# Patient Record
Sex: Female | Born: 1960 | Race: White | Hispanic: No | Marital: Married | Smoking: Current every day smoker
Health system: Southern US, Community
[De-identification: ages and names within clinical notes are randomized; demographics above are authoritative.]

## PROBLEM LIST (undated history)

## (undated) DIAGNOSIS — K219 Gastro-esophageal reflux disease without esophagitis: Secondary | ICD-10-CM

## (undated) DIAGNOSIS — F329 Major depressive disorder, single episode, unspecified: Secondary | ICD-10-CM

## (undated) DIAGNOSIS — I10 Essential (primary) hypertension: Secondary | ICD-10-CM

## (undated) DIAGNOSIS — F32A Depression, unspecified: Secondary | ICD-10-CM

## (undated) DIAGNOSIS — F988 Other specified behavioral and emotional disorders with onset usually occurring in childhood and adolescence: Secondary | ICD-10-CM

## (undated) DIAGNOSIS — F419 Anxiety disorder, unspecified: Secondary | ICD-10-CM

## (undated) HISTORY — PX: FINGER SURGERY: SHX640

## (undated) HISTORY — DX: Depression, unspecified: F32.A

## (undated) HISTORY — DX: Other specified behavioral and emotional disorders with onset usually occurring in childhood and adolescence: F98.8

## (undated) HISTORY — DX: Major depressive disorder, single episode, unspecified: F32.9

## (undated) HISTORY — DX: Anxiety disorder, unspecified: F41.9

---

## 1998-11-13 ENCOUNTER — Inpatient Hospital Stay (HOSPITAL_COMMUNITY): Admission: EM | Admit: 1998-11-13 | Discharge: 1998-11-17 | Payer: Self-pay | Admitting: *Deleted

## 1998-11-13 ENCOUNTER — Emergency Department (HOSPITAL_COMMUNITY): Admission: EM | Admit: 1998-11-13 | Discharge: 1998-11-13 | Payer: Self-pay | Admitting: Emergency Medicine

## 1998-11-18 ENCOUNTER — Encounter (HOSPITAL_COMMUNITY): Admission: RE | Admit: 1998-11-18 | Discharge: 1999-02-16 | Payer: Self-pay

## 2001-06-19 ENCOUNTER — Encounter: Admission: RE | Admit: 2001-06-19 | Discharge: 2001-06-19 | Payer: Self-pay | Admitting: Family Medicine

## 2001-06-19 ENCOUNTER — Encounter: Payer: Self-pay | Admitting: Family Medicine

## 2003-07-14 ENCOUNTER — Emergency Department (HOSPITAL_COMMUNITY): Admission: EM | Admit: 2003-07-14 | Discharge: 2003-07-14 | Payer: Self-pay | Admitting: Emergency Medicine

## 2005-04-27 ENCOUNTER — Encounter: Admission: RE | Admit: 2005-04-27 | Discharge: 2005-04-27 | Payer: Self-pay | Admitting: Family Medicine

## 2005-04-28 ENCOUNTER — Inpatient Hospital Stay (HOSPITAL_COMMUNITY): Admission: AD | Admit: 2005-04-28 | Discharge: 2005-04-28 | Payer: Self-pay | Admitting: Obstetrics and Gynecology

## 2005-05-01 ENCOUNTER — Inpatient Hospital Stay (HOSPITAL_COMMUNITY): Admission: AD | Admit: 2005-05-01 | Discharge: 2005-05-01 | Payer: Self-pay | Admitting: Obstetrics and Gynecology

## 2005-05-04 ENCOUNTER — Inpatient Hospital Stay (HOSPITAL_COMMUNITY): Admission: AD | Admit: 2005-05-04 | Discharge: 2005-05-04 | Payer: Self-pay | Admitting: Obstetrics & Gynecology

## 2005-11-13 ENCOUNTER — Encounter: Admission: RE | Admit: 2005-11-13 | Discharge: 2005-11-13 | Payer: Self-pay | Admitting: Family Medicine

## 2008-08-24 LAB — CONVERTED CEMR LAB: Pap Smear: NORMAL

## 2008-11-11 ENCOUNTER — Ambulatory Visit: Payer: Self-pay | Admitting: Internal Medicine

## 2008-11-11 DIAGNOSIS — Z9189 Other specified personal risk factors, not elsewhere classified: Secondary | ICD-10-CM | POA: Insufficient documentation

## 2008-11-11 DIAGNOSIS — F329 Major depressive disorder, single episode, unspecified: Secondary | ICD-10-CM

## 2008-11-11 DIAGNOSIS — F988 Other specified behavioral and emotional disorders with onset usually occurring in childhood and adolescence: Secondary | ICD-10-CM | POA: Insufficient documentation

## 2008-11-11 DIAGNOSIS — F341 Dysthymic disorder: Secondary | ICD-10-CM | POA: Insufficient documentation

## 2008-11-11 DIAGNOSIS — K148 Other diseases of tongue: Secondary | ICD-10-CM | POA: Insufficient documentation

## 2008-11-11 HISTORY — DX: Dysthymic disorder: F34.1

## 2009-01-12 ENCOUNTER — Ambulatory Visit: Payer: Self-pay | Admitting: Internal Medicine

## 2009-01-12 DIAGNOSIS — L708 Other acne: Secondary | ICD-10-CM

## 2009-03-23 ENCOUNTER — Ambulatory Visit: Payer: Self-pay | Admitting: Family Medicine

## 2009-03-23 DIAGNOSIS — R1031 Right lower quadrant pain: Secondary | ICD-10-CM | POA: Insufficient documentation

## 2009-03-23 LAB — CONVERTED CEMR LAB
ALT: 18 units/L (ref 0–35)
AST: 17 units/L (ref 0–37)
Albumin: 4.3 g/dL (ref 3.5–5.2)
Calcium: 9.1 mg/dL (ref 8.4–10.5)
Chloride: 104 meq/L (ref 96–112)
Lymphocytes Relative: 34 % (ref 12–46)
Lymphs Abs: 1.9 10*3/uL (ref 0.7–4.0)
Neutrophils Relative %: 56 % (ref 43–77)
Platelets: 240 10*3/uL (ref 150–400)
Potassium: 4.1 meq/L (ref 3.5–5.3)
Sodium: 139 meq/L (ref 135–145)
WBC: 5.7 10*3/uL (ref 4.0–10.5)

## 2010-05-19 ENCOUNTER — Emergency Department (HOSPITAL_COMMUNITY): Admission: EM | Admit: 2010-05-19 | Discharge: 2010-05-19 | Payer: Self-pay | Admitting: Emergency Medicine

## 2010-05-24 ENCOUNTER — Ambulatory Visit: Payer: Self-pay | Admitting: Internal Medicine

## 2010-05-24 DIAGNOSIS — R0789 Other chest pain: Secondary | ICD-10-CM

## 2010-07-04 ENCOUNTER — Telehealth (INDEPENDENT_AMBULATORY_CARE_PROVIDER_SITE_OTHER): Payer: Self-pay | Admitting: *Deleted

## 2010-09-25 ENCOUNTER — Encounter: Payer: Self-pay | Admitting: Family Medicine

## 2010-10-04 NOTE — Progress Notes (Signed)
  Phone Note Other Incoming   Request: Send information Summary of Call: Request for records received from Dr. Doreatha Martin. Request forwarded to [facility].

## 2010-10-04 NOTE — Assessment & Plan Note (Signed)
Summary: FOLLOW UP FROM [facility]--Rm 2   Vital Signs:  Patient profile:   50 year old female Menstrual status:  regular Height:      68.75 inches Weight:      133.75 pounds BMI:     19.97 O2 Sat:      100 % on Room air Temp:     98.5 degrees F oral Pulse rate:   108 / minute Pulse rhythm:   regular Resp:     16 per minute BP sitting:   144 / 90  (right arm) Cuff size:   regular  Vitals Entered By: Mervin Kung CMA Duncan Dull) (May 24, 2010 1:42 PM)  O2 Flow:  Room air CC: Rm 2  Follow up from ER visit last Thursday. Is Patient Diabetic? No Comments Pt states she can't take Flexeril with the Pristiq and she would like to find something that can be taken with Pristiq.  Methylphenidate 10mg  increased to 1 three times a day. Methylphenidate 5mg  decreased to 1 once daily. Xanax increased to 1 once daily.  Nicki Guadalajara Fergerson CMA Duncan Dull)  May 24, 2010 1:49 PM    Primary Care Provider:  D. Thomos Lemons DO  CC:  Rm 2  Follow up from ER visit last Thursday.Marland Kitchen  History of Present Illness: 50 y/o female for ER f/u pt evaluated for chest pain on and off chest pain,  non exertional for 3-4 weeks typically working at the computer lasts for 3-4 hrs feels like pressure,  feels like bruise after mild assoc shortness nap seemed help xanax also seems to help diastolic bp elevated increase in belching severity 2 out of 10   a lot of stress - new job,  going to school for accounting  3 cups of coffee  CXR - - NAD D Dimer normal EKG - sinus tach at 121 bpm,  ST wave abnormality, consider inferior infarct CBC, TFTs normal    Preventive Screening-Counseling & Management  Alcohol-Tobacco     Alcohol drinks/day: 0     Smoking Status: quit < 6 months     Packs/Day: 4 cigarettes daily     Year Started: 1980  Allergies (verified): No Known Drug Allergies  Past History:  Past Medical History: Depression -  previously on Effexor Previous hx of drug and alcohol  use  (crack cocaine) - drug free 9 yrs  ADD - Dx by Dr. Rise Mu  (Dr Jennelle Human) Hx of frequent headaches  GERD Adult Acne  Hx of ectopic pregnancy Depression  Past Surgical History: Tonsillectomy     Family History: Family hx of depression - father was alcoholic,  mother from dysfunctional family Sister has OCD, depression, high cholesterol, htn Niece - ? 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  Review of Systems  The patient denies chest pain, dyspnea on exertion, and prolonged cough.    Physical Exam  General:  alert and thin Head:  normocephalic and atraumatic.   Chest Wall:  no chest wall tenderness Lungs:  normal respiratory effort, normal breath sounds, no crackles, and no wheezes.   Heart:  regular rhythm, no murmur, no gallop, and tachycardia.   Abdomen:  soft, non-tender, and normal bowel sounds.   Extremities:  No lower extremity  edema  Neurologic:  cranial nerves II-XII intact and gait normal.   Psych:  normally interactive, good eye contact, not anxious appearing, and not depressed appearing.     Impression & Recommendations:  Problem # 1:  CHEST PAIN, ATYPICAL (ICD-786.59) atypical chest pain. probable GERD use PPI,  avoid caffeine Patient advised to call office if symptoms persist or worsen.  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 One by mouth once a day 2)  Omeprazole 20 Mg Tbec (Omeprazole) .... Take 1 tablet by mouth once a day 3)  Flexeril 5 Mg Tabs (Cyclobenzaprine hcl) .... Take 1 tablet by mouth once a day as needed back pain 4)   Retin-a 0.025 % Crea (Tretinoin) .... Apply to affected area at bedtime 5)  Xanax 0.5 Mg Tabs (Alprazolam) .... Take 1  tablet by mouth once a day as needed 6)  Acetaminophen Extra Strength 500 Mg Tabs (Acetaminophen) .... Take 1 tablet by mouth two times a day as needed back pain 7)  Pristiq 50 Mg Xr24h-tab (Desvenlafaxine succinate) .... Take 1 tablet by mouth every morning 8)  Minocycline Hcl 50 Mg Caps (Minocycline hcl) .... One by mouth qd  Patient Instructions: 1)  Please schedule a follow-up appointment in 1 month. 2)  Call our office if your symptoms do not  improve or gets worse. 3)  Decrease your caffeine use to one cup per day Prescriptions: OMEPRAZOLE 20 MG TBEC (OMEPRAZOLE) Take 1 tablet by mouth once a day  #90 x 1   Entered and Authorized by:   D. Thomos Lemons DO   Signed by:   D. Thomos Lemons DO on 05/24/2010   Method used:   Electronically to        [facility]  [address] [phone]* (retail)       [address]       [place], [place]  [address]       Ph: [phone] or [phone]       Fax: [phone]   RxID:   [id number]   Current Allergies (reviewed today): No known allergies

## 2010-11-17 LAB — COMPREHENSIVE METABOLIC PANEL
Alkaline Phosphatase: 75 U/L (ref 39–117)
BUN: 7 mg/dL (ref 6–23)
CO2: 30 mEq/L (ref 19–32)
Chloride: 104 mEq/L (ref 96–112)
GFR calc non Af Amer: >60 mL/min (ref >60)
Glucose, Bld: 91 mg/dL (ref 70–99)
Potassium: 3.6 mEq/L (ref 3.5–5.1)
Total Bilirubin: 1 mg/dL (ref 0.3–1.2)

## 2010-11-17 LAB — POCT I-STAT, CHEM 8
Calcium, Ion: 1.16 mmol/L (ref 1.12–1.32)
Glucose, Bld: 101 mg/dL — ABNORMAL HIGH (ref 70–99)
HCT: 42 % (ref 36.0–46.0)
Hemoglobin: 14.3 g/dL (ref 12.0–15.0)
Potassium: 3.6 mEq/L (ref 3.5–5.1)
TCO2: 27 mmol/L (ref 0–100)

## 2010-11-17 LAB — CBC
HCT: 39.1 % (ref 36.0–46.0)
MCHC: 33.9 g/dL (ref 30.0–36.0)
RDW: 12.9 % (ref 11.5–15.5)

## 2010-11-17 LAB — T4, FREE: Free T4: 0.85 ng/dL (ref 0.80–1.80)

## 2010-11-17 LAB — DIFFERENTIAL
Basophils Absolute: 0 10*3/uL (ref 0.0–0.1)
Basophils Relative: 1 % (ref 0–1)
Monocytes Absolute: 0.3 10*3/uL (ref 0.1–1.0)
Neutro Abs: 5.4 10*3/uL (ref 1.7–7.7)
Neutrophils Relative %: 74 % (ref 43–77)

## 2010-11-17 LAB — POCT CARDIAC MARKERS: CKMB, poc: <1 ng/mL — ABNORMAL LOW (ref 1.0–8.0)

## 2010-11-17 LAB — MAGNESIUM: Magnesium: 2.1 mg/dL (ref 1.5–2.5)

## 2012-02-08 ENCOUNTER — Emergency Department (HOSPITAL_COMMUNITY)
Admission: EM | Admit: 2012-02-08 | Discharge: 2012-02-08 | Disposition: A | Discharge status: Home / Self Care | Payer: BC Managed Care – PPO | Attending: Emergency Medicine | Admitting: Emergency Medicine

## 2012-02-08 ENCOUNTER — Encounter (HOSPITAL_COMMUNITY): Payer: Self-pay | Admitting: Emergency Medicine

## 2012-02-08 ENCOUNTER — Emergency Department (HOSPITAL_COMMUNITY): Payer: BC Managed Care – PPO

## 2012-02-08 DIAGNOSIS — F17200 Nicotine dependence, unspecified, uncomplicated: Secondary | ICD-10-CM | POA: Insufficient documentation

## 2012-02-08 DIAGNOSIS — F341 Dysthymic disorder: Secondary | ICD-10-CM | POA: Insufficient documentation

## 2012-02-08 DIAGNOSIS — E876 Hypokalemia: Secondary | ICD-10-CM | POA: Insufficient documentation

## 2012-02-08 DIAGNOSIS — K219 Gastro-esophageal reflux disease without esophagitis: Secondary | ICD-10-CM | POA: Insufficient documentation

## 2012-02-08 DIAGNOSIS — I10 Essential (primary) hypertension: Secondary | ICD-10-CM | POA: Insufficient documentation

## 2012-02-08 DIAGNOSIS — R079 Chest pain, unspecified: Secondary | ICD-10-CM | POA: Insufficient documentation

## 2012-02-08 HISTORY — DX: Gastro-esophageal reflux disease without esophagitis: K21.9

## 2012-02-08 HISTORY — DX: Major depressive disorder, single episode, unspecified: F32.9

## 2012-02-08 HISTORY — DX: Essential (primary) hypertension: I10

## 2012-02-08 HISTORY — DX: Depression, unspecified: F32.A

## 2012-02-08 HISTORY — DX: Anxiety disorder, unspecified: F41.9

## 2012-02-08 LAB — DIFFERENTIAL
Basophils Absolute: 0 10*3/uL (ref 0.0–0.1)
Eosinophils Absolute: 0.1 10*3/uL (ref 0.0–0.7)
Eosinophils Relative: 1 % (ref 0–5)
Monocytes Absolute: 0.3 10*3/uL (ref 0.1–1.0)

## 2012-02-08 LAB — RAPID URINE DRUG SCREEN, HOSP PERFORMED
Barbiturates: NOT DETECTED
Benzodiazepines: NOT DETECTED
Cocaine: NOT DETECTED
Tetrahydrocannabinol: NOT DETECTED

## 2012-02-08 LAB — CBC
HCT: 37.1 % (ref 36.0–46.0)
MCH: 27.6 pg (ref 26.0–34.0)
MCV: 86.1 fL (ref 78.0–100.0)
Platelets: 208 10*3/uL (ref 150–400)
RDW: 14 % (ref 11.5–15.5)

## 2012-02-08 LAB — BASIC METABOLIC PANEL
Calcium: 9.2 mg/dL (ref 8.4–10.5)
Creatinine, Ser: 0.68 mg/dL (ref 0.50–1.10)
GFR calc non Af Amer: >90 mL/min (ref >90)
Glucose, Bld: 95 mg/dL (ref 70–99)
Sodium: 135 mEq/L (ref 135–145)

## 2012-02-08 LAB — TROPONIN I: Troponin I: <0.3 ng/mL (ref <0.30)

## 2012-02-08 MED ORDER — PANTOPRAZOLE SODIUM 40 MG IV SOLR
40.0000 mg | Freq: Once | INTRAVENOUS | Status: AC
  Administered 2012-02-08: 40 mg via INTRAVENOUS
  Filled 2012-02-08: qty 40

## 2012-02-08 MED ORDER — POTASSIUM CHLORIDE CRYS ER 20 MEQ PO TBCR
40.0000 meq | EXTENDED_RELEASE_TABLET | Freq: Once | ORAL | Status: AC
  Administered 2012-02-08: 40 meq via ORAL
  Filled 2012-02-08: qty 1

## 2012-02-08 NOTE — ED Notes (Signed)
Pt aware that we need to collect urine specimen. 

## 2012-02-08 NOTE — ED Notes (Signed)
Pt received from PDA. Pt placed on monitor.

## 2012-02-08 NOTE — Discharge Instructions (Signed)
Read the information below.  Please call your primary care provider tomorrow morning to schedule a close follow up appointment for further evaluation of your chest pain.  You may return to the ER at any time for worsening condition or any new symptoms that concern you.

## 2012-02-08 NOTE — ED Notes (Signed)
Pt with chest pressure constant for the last week. Went to PMD today for eval. 12lead ekg in office. 324mg  ASA given at pmd office. 1 nitro SL for EMS in route no change in pain. Feeling tired, weak. 20g l hand. Restarted bp 2 days ago.

## 2012-02-08 NOTE — ED Notes (Signed)
Report given to [facility] in CDU;  Ambulatory to CDU at this time

## 2012-02-08 NOTE — ED Provider Notes (Addendum)
History     CSN: [id number]  Arrival date & time 02/08/12  1500   First MD Initiated Contact with Patient 02/08/12 1501      Chief Complaint  Patient presents with  . Chest Pain    (Consider location/radiation/quality/duration/timing/severity/associated sxs/prior treatment) HPI Comments: Patient presents for chest pressure that ongoing over the last week.  Patient has no specific shortness of breath.  She states she's had intermittent nausea but no vomiting.  No cough or fevers.  Patient notes that she feels slightly more tired this last week.  She's not been taking her blood pressure medication or her acid reflux medication over the last week.  She did start taking them again 2 days ago.  She went to her primary care physician's office today and they sent her here for further evaluation.  Patient notes her pain is a 2/10 which is where it has remained throughout today.  The nitroglycerin she received prior to arrival did not change her pain.  She has already received 324 mg of aspirin as well.    Patient is a 51 y.o. female presenting with chest pain. The history is provided by the patient and the EMS personnel. No language interpreter was used.  Chest Pain Primary symptoms include nausea. Pertinent negatives for primary symptoms include no fever, no shortness of breath, no cough, no abdominal pain and no vomiting.     Past Medical History  Diagnosis Date  . Hypertension   . GERD (gastroesophageal reflux disease)   . Anxiety and depression     No past surgical history on file.  No family history on file.  History  Substance Use Topics  . Smoking status: Current Everyday Smoker -- 0.5 packs/day  . Smokeless tobacco: Not on file  . Alcohol Use: No     Alcohol abuse in remission    OB History    Grav Para Term Preterm Abortions TAB SAB Ect Mult Living                  Review of Systems  Constitutional: Negative.  Negative for fever and chills.  HENT: Negative.     Eyes: Negative.  Negative for discharge and redness.  Respiratory: Negative.  Negative for cough and shortness of breath.   Cardiovascular: Positive for chest pain.  Gastrointestinal: Positive for nausea. Negative for vomiting, abdominal pain and diarrhea.  Genitourinary: Negative.  Negative for dysuria.  Musculoskeletal: Negative.  Negative for back pain.  Skin: Negative.  Negative for color change and rash.  Neurological: Negative.  Negative for syncope and headaches.  Hematological: Negative.  Negative for adenopathy.  Psychiatric/Behavioral: Negative.  Negative for confusion.  All other systems reviewed and are negative.    Allergies  Review of patient's allergies indicates no known allergies.  Home Medications  No current outpatient prescriptions on file.  BP 144/90  Pulse 90  Temp 98.1 F (36.7 C)  Resp 11  SpO2 100%  Physical Exam  Nursing note and vitals reviewed. Constitutional: She is oriented to person, place, and time. She appears well-developed and well-nourished.  Non-toxic appearance. She does not have a sickly appearance.  HENT:  Head: Normocephalic and atraumatic.  Eyes: Conjunctivae, EOM and lids are normal. Pupils are equal, round, and reactive to light. No scleral icterus.  Neck: Trachea normal and normal range of motion. Neck supple.  Cardiovascular: Normal rate, regular rhythm and normal heart sounds.   Pulmonary/Chest: Effort normal and breath sounds normal. No respiratory distress. She has no wheezes.  She has no rales.  Abdominal: Soft. Normal appearance. There is no tenderness. There is no rebound, no guarding and no CVA tenderness.  Musculoskeletal: Normal range of motion. She exhibits no edema.  Neurological: She is alert and oriented to person, place, and time. She has normal strength.  Skin: Skin is warm, dry and intact. No rash noted.  Psychiatric: She has a normal mood and affect. Her behavior is normal. Judgment and thought content normal.     ED Course  Procedures (including critical care time)   Results for orders placed during the hospital encounter of 02/08/12  CBC      Component Value Range   WBC 5.8  4.0 - 10.5 (K/uL)   RBC 4.31  3.87 - 5.11 (MIL/uL)   Hemoglobin 11.9 (*) 12.0 - 15.0 (g/dL)   HCT 16.1  09.6 - 04.5 (%)   MCV 86.1  78.0 - 100.0 (fL)   MCH 27.6  26.0 - 34.0 (pg)   MCHC 32.1  30.0 - 36.0 (g/dL)   RDW 40.9  81.1 - 91.4 (%)   Platelets 208  150 - 400 (K/uL)  DIFFERENTIAL      Component Value Range   Neutrophils Relative 71  43 - 77 (%)   Neutro Abs 4.2  1.7 - 7.7 (K/uL)   Lymphocytes Relative 22  12 - 46 (%)   Lymphs Abs 1.3  0.7 - 4.0 (K/uL)   Monocytes Relative 5  3 - 12 (%)   Monocytes Absolute 0.3  0.1 - 1.0 (K/uL)   Eosinophils Relative 1  0 - 5 (%)   Eosinophils Absolute 0.1  0.0 - 0.7 (K/uL)   Basophils Relative 1  0 - 1 (%)   Basophils Absolute 0.0  0.0 - 0.1 (K/uL)  BASIC METABOLIC PANEL      Component Value Range   Sodium 135  135 - 145 (mEq/L)   Potassium 3.0 (*) 3.5 - 5.1 (mEq/L)   Chloride 96  96 - 112 (mEq/L)   CO2 27  19 - 32 (mEq/L)   Glucose, Bld 95  70 - 99 (mg/dL)   BUN 8  6 - 23 (mg/dL)   Creatinine, Ser 7.82  0.50 - 1.10 (mg/dL)   Calcium 9.2  8.4 - 95.6 (mg/dL)   GFR calc non Af Amer >90  >90 (mL/min)   GFR calc Af Amer >90  >90 (mL/min)  TROPONIN I      Component Value Range   Troponin I <0.30  <0.30 (ng/mL)   Dg Chest 2 View  02/08/2012  *RADIOLOGY REPORT*  Clinical Data: Chest pain.  Smoker.  History of hypertension.  CHEST - 2 VIEW  Comparison: 05/19/2010.  Findings: Normal sized heart.  Clear lungs.  The lungs are mildly hyperexpanded with mild diffuse peribronchial thickening.  Minimal scoliosis.  IMPRESSION: Mild changes of COPD and chronic bronchitis.  No acute abnormality.  Original Report Authenticated By: Darrol Angel, M.D.      Date: 02/08/2012  Rate: 90  Rhythm: normal sinus rhythm  QRS Axis: normal  Intervals: normal  ST/T Wave abnormalities:  nonspecific ST/T changes  Conduction Disutrbances:none  Narrative Interpretation:   Old EKG Reviewed: unchanged from 05/19/2010     MDM  Patient with symptoms of chest pressure over the last week seems to worsen with exertion and improved with rest.  She does have some risk factors given a smoking history, hypertension and remote use of cocaine.  Patient's EKG is unchanged from her prior EKG in 2011.  Patient may be a good candidate to receive multiple substance of cardiac markers and if they are normal be arranged for an outpatient stress test.  Nat Christen, MD 02/08/12 1517  Patient has an initial troponin that is negative.  Her potassium is slightly low and should be given potassium here for supplementation.  Patient is relatively low risk and I did offer her the lowest chest pain protocol to stay overnight for a stress test in the morning.  Patient prefers to be discharged after her second cardiac marker with followup with her primary care physician for an outpatient stress test.  Given that the patient is relatively low risk I feel that this is also an appropriate pathway for this patient.  TIMI score 0.    Patient to be moved to CDU while awaiting her repeat troponin.  Nat Christen, MD 02/08/12 [phone]

## 2012-02-08 NOTE — ED Provider Notes (Signed)
5:52 PM Patient is in CDU holding for repeat troponin at 6:15pm.  This is a shared visit with Dr Golda Acre.  Pt has no needs at this time. Pt continues to decline chest pain protocol. On exam, pt is A&Ox4, NAD, RRR, no m/r/g, CTAB, abd soft, NT, extremities without edema, distal pulses intact and equal bilaterally.     7:09 PM Repeat troponin is negative.  Pt to be d/c home with PCP follow up.  Patient verbalizes understanding and agrees with plan.    Rise Patience, [place] 02/08/12 1910

## 2012-02-08 NOTE — ED Notes (Signed)
Pt denies any chest pain or discomfort at this time.  Pt eating sandwich from [employer], husband at bedside.

## 2012-02-10 NOTE — ED Provider Notes (Signed)
Medical screening examination/treatment/procedure(s) were conducted as a shared visit with non-physician practitioner(s) and myself.  I personally evaluated the patient during the encounter   Nat Christen, MD 02/10/12 [phone]

## 2013-12-27 ENCOUNTER — Ambulatory Visit (INDEPENDENT_AMBULATORY_CARE_PROVIDER_SITE_OTHER): Payer: BC Managed Care – PPO | Admitting: Family Medicine

## 2013-12-27 VITALS — BP 122/74 | HR 73 | Temp 99.0°F | Resp 18 | Ht 69.0 in | Wt 139.0 lb

## 2013-12-27 DIAGNOSIS — S61419A Laceration without foreign body of unspecified hand, initial encounter: Secondary | ICD-10-CM

## 2013-12-27 DIAGNOSIS — Z23 Encounter for immunization: Secondary | ICD-10-CM

## 2013-12-27 DIAGNOSIS — S66829A Laceration of other specified muscles, fascia and tendons at wrist and hand level, unspecified hand, initial encounter: Secondary | ICD-10-CM

## 2013-12-27 DIAGNOSIS — S61012A Laceration without foreign body of left thumb without damage to nail, initial encounter: Secondary | ICD-10-CM

## 2013-12-27 DIAGNOSIS — S61209A Unspecified open wound of unspecified finger without damage to nail, initial encounter: Secondary | ICD-10-CM

## 2013-12-27 DIAGNOSIS — M79609 Pain in unspecified limb: Secondary | ICD-10-CM

## 2013-12-27 DIAGNOSIS — M79645 Pain in left finger(s): Secondary | ICD-10-CM

## 2013-12-27 MED ORDER — CEPHALEXIN 500 MG PO CAPS: 500.0000 mg | ORAL_CAPSULE | Freq: Three times a day (TID) | ORAL | Status: DC

## 2013-12-27 MED ORDER — FLUCONAZOLE 150 MG PO TABS: 150.0000 mg | ORAL_TABLET | Freq: Once | ORAL | Status: DC

## 2013-12-27 NOTE — Progress Notes (Signed)
I directly supervised and participated in the procedure and agree with the student's documentation. Due to joint capsule and extensor tendon damage, the patient is started on antibiotics and referred to Hand Surgery.

## 2013-12-27 NOTE — Patient Instructions (Signed)
WOUND CARE . Keep area clean and dry for 24 hours. Do not remove bandage, if applied. . After 24 hours, remove bandage and wash wound gently with mild soap and warm water. Reapply a new bandage after cleaning wound, if directed. . Continue daily cleansing with soap and water until stitches/staples are removed. . Do not apply any ointments or creams to the wound while stitches/staples are in place, as this may cause delayed healing. . Notify the office if you experience any of the following signs of infection: Swelling, redness, pus drainage, streaking, fever >101.0 F . Notify the office if you experience excessive bleeding that does not stop after 15-20 minutes of constant, firm pressure.   WE WILL CONTACT YOU ON Monday WITH THE APPOINTMENT WITH THE HAND SURGEON.  IF YOU HAVE NOT HEARD FROM US BY Monday AT 4:00PM, PLEASE CALL OUR REFERRAL COORDINATOR FOR AN UPDATE AT [phone].

## 2013-12-27 NOTE — Progress Notes (Signed)
VCO.  Wound of L thumb washed with soap and water.  Sterile drape applied.  Digital block with 3cc of 2% lidocaine, additional local with 0.5cc.  Joint capsule is damaged.  Also extensor tendon appears partially lacerated. Closed skin with #1 5-0 prolene SI suture and #1 HM suture.  Wound cleaned.  Non-adhesive bandage applied.

## 2013-12-27 NOTE — Progress Notes (Addendum)
This chart was scribed for Ethelda ChickKristi M Oliva Montecalvo, MD by Luisa DagoPriscilla Tutu, ED Scribe. This patient was seen in room 6 and the patient's care was started at 1:23 PM. Subjective:    Patient ID: Kristy Rivas, female    DOB: 12/12/1960, [age] y.o.   MRN: [id number]  12/27/2013  Laceration  HPI HPI Comments: Kristy Rivas is a 53 y.o. female who presents to the [facility] complaining of a laceration to left thumb that occurred about 1 hour ago. Pt states that a picture frame fell on her head and then cut her L  hand. She denies any head trauma or LOC. Her last tetanus vaccine was in 2009. Pt denies any numbness or tingling to her left thumb. Pt is Right hand dominant. She states that she experiences associated pain when she tries to move her thumb. Denies any paraesthesia, fever, or chills.   Pt is an Airline pilotaccountant by trade.  Review of Systems  Constitutional: Negative for fever and chills.  HENT: Negative for congestion.   Respiratory: Negative for shortness of breath.   Gastrointestinal: Negative for nausea, vomiting, abdominal pain and diarrhea.  Skin: Positive for wound (laceration to left thumb). Negative for color change and pallor.  Neurological: Negative for weakness and numbness.    Past Medical History  Diagnosis Date  . Hypertension   . GERD (gastroesophageal reflux disease)   . Anxiety and depression   . Anxiety   . Depression    No Known Allergies Current Outpatient Prescriptions  Medication Sig Dispense Refill  . ALPRAZolam (XANAX) 0.5 MG tablet Take 0.5 mg by mouth at bedtime as needed. For sleep      . desvenlafaxine (PRISTIQ) 100 MG 24 hr tablet Take 100 mg by mouth daily.      . hydrOXYzine (ATARAX/VISTARIL) 50 MG tablet Take 50 mg by mouth 3 times/day as needed-between meals & bedtime.      Marland Kitchen. losartan (COZAAR) 25 MG tablet Take 25 mg by mouth daily.      . methylphenidate (RITALIN) 10 MG tablet Take 10 mg by mouth 3 (three) times daily.      Marland Kitchen.  omeprazole (PRILOSEC) 20 MG capsule Take 40 mg by mouth daily.      Marland Kitchen. tretinoin (RETIN-A) 0.05 % cream Apply 1 application topically at bedtime.      Marland Kitchen. acetaminophen (TYLENOL) 500 MG tablet Take 500 mg by mouth every 6 (six) hours as needed. For pain      . cephALEXin (KEFLEX) 500 MG capsule Take 1 capsule (500 mg total) by mouth 3 (three) times daily.  30 capsule  0  . fluconazole (DIFLUCAN) 150 MG tablet Take 1 tablet (150 mg total) by mouth once. Repeat if needed  2 tablet  0   No current facility-administered medications for this visit.   History   Social History  . Marital Status: Married    Spouse Name: N/A    Number of Children: N/A  . Years of Education: N/A   Occupational History  . Not on file.   Social History Main Topics  . Smoking status: Current Every Day Smoker -- 0.50 packs/day  . Smokeless tobacco: Not on file  . Alcohol Use: No     Comment: Alcohol abuse in remission  . 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 Sexual Activity:    Other Topics Concern  . Not on file   Social History Narrative  .  No narrative on file       Objective:    BP 122/74  Pulse 73  Temp(Src) 99 F (37.2 C) (Oral)  Resp 18  Ht 5\' 9"  (1.753 m)  Wt 139 lb (63.05 kg)  BMI 20.52 kg/m2  SpO2 100%  LMP 12/25/2013  Physical Exam  Nursing note and vitals reviewed. Constitutional: She is oriented to person, place, and time. She appears well-developed and well-nourished. No distress.  HENT:  Head: Normocephalic and atraumatic.  Eyes: Conjunctivae and EOM are normal. Pupils are equal, round, and reactive to light. Right eye exhibits no discharge. Left eye exhibits no discharge.  Neck: Neck supple.  Cardiovascular: Normal rate, regular rhythm and normal heart sounds.  Exam reveals no gallop and no friction rub.   No murmur heard. Pulmonary/Chest: Effort normal and breath sounds normal. No respiratory distress.  Abdominal: Soft. She exhibits no  distension. There is no tenderness.  Musculoskeletal: She exhibits no edema and no tenderness.  FROM of left thumb. Motor 5/5 L thumb.    Neurological: She is alert and oriented to person, place, and time. No cranial nerve deficit. She exhibits normal muscle tone. Coordination normal.   No sensory or motor deficits appreciated.   Skin: Skin is warm and dry.  Horizonatal laceration to her left proximal thumb. No active bleeding.  Psychiatric: She has a normal mood and affect. Her behavior is normal. Thought content normal.       Assessment & Plan:   1:35 PM- Will order a laceration repair procedure. Pt advised of plan for treatment and pt agrees.  1. Pain of left thumb   2. Laceration of left thumb   3. Need for prophylactic vaccination with combined diphtheria-tetanus-pertussis (DTP) vaccine   4. Laceration of extensor tendon of hand    1. Pain L thumb:  New. Secondary to laceration; recommend Tylenol or Motrin. 2.  L thumb laceration:  New. With capsular involvement of IP joint and extensor tendon partial laceration; refer to hand surgery within upcoming 2-3 days.  Rx for Keflex provided.   S/p suture repair in office; s/p TDAP.  Local wound care reviewed. 3.  S/p TDAP.   Meds ordered this encounter  Medications  . hydrOXYzine (ATARAX/VISTARIL) 50 MG tablet    Sig: Take 50 mg by mouth 3 times/day as needed-between meals & bedtime.  . cephALEXin (KEFLEX) 500 MG capsule    Sig: Take 1 capsule (500 mg total) by mouth 3 (three) times daily.    Dispense:  30 capsule    Refill:  0  . fluconazole (DIFLUCAN) 150 MG tablet    Sig: Take 1 tablet (150 mg total) by mouth once. Repeat if needed    Dispense:  2 tablet    Refill:  0    No Follow-up on file.   I personally performed the services described in this documentation, which was scribed in my presence. The recorded information has been reviewed and is accurate.  Nilda SimmerKristi Shifra Swartzentruber, M.D.  [facility]  [facility] [address] [place], [place]  [id number] [phone] phone [phone] fax

## 2014-02-01 ENCOUNTER — Other Ambulatory Visit: Payer: Self-pay | Admitting: Family Medicine

## 2014-02-02 NOTE — Telephone Encounter (Signed)
Dr Katrinka Blazing, you gave pt Rx of diflucan 12/27/13 when you Rxd Keflex. Do you want to give her a RF or RTC for eval?

## 2014-09-08 ENCOUNTER — Telehealth: Payer: Self-pay | Admitting: *Deleted

## 2014-09-08 ENCOUNTER — Ambulatory Visit (INDEPENDENT_AMBULATORY_CARE_PROVIDER_SITE_OTHER): Payer: No Typology Code available for payment source | Admitting: Emergency Medicine

## 2014-09-08 VITALS — BP 120/78 | HR 69 | Temp 98.5°F | Resp 16 | Ht 68.5 in | Wt 152.5 lb

## 2014-09-08 DIAGNOSIS — R079 Chest pain, unspecified: Secondary | ICD-10-CM

## 2014-09-08 NOTE — Progress Notes (Signed)
[facility] [address], [place] [address] [phone]  Date:  09/08/2014   Name:  Kristy Rivas   DOB:  10/15/1960   MRN:  [id number]  PCP:  Thomos Lemonsobert Yoo, DO    Chief Complaint: Medication Refill   History of Present Illness:  Kristy Rivas is a 54 y.o. very pleasant female patient who presents with the following:  Patient has a complicated number of problems.  She has been off he antidepressant due to job loss She has been splitting her antihypertensive to stretch it and today ran out. She has a history of recurrent chest pain that has been evaluated in the er in the past.  She says the pain was due to stress and she dumped her husband She has chest pain now that has been present since 1500 today.  The patient was asked to undergo an EKG due to her pain and left without completing her examination  Patient Active Problem List   Diagnosis Date Noted  . CHEST PAIN, ATYPICAL 05/24/2010  . RLQ PAIN 03/23/2009  . CYSTIC ACNE 01/12/2009  . DEPRESSION 11/11/2008  . 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 OTHER SPECIFIED CONDITIONS OF THE TONGUE 11/11/2008  . DRUG ABUSE, HX OF 11/11/2008    Past Medical History  Diagnosis Date  . Hypertension   . GERD (gastroesophageal reflux disease)   . Anxiety and depression   . Anxiety   . Depression     Past Surgical History  Procedure Laterality Date  . Finger surgery      History  Substance Use Topics  . Smoking status: Former Smoker -- 0.50 packs/day  . Smokeless tobacco: Never Used  . Alcohol Use: No     Comment: Alcohol abuse in remission    History reviewed. No pertinent family history.  No Known Allergies  Medication list has been reviewed and updated.  Current Outpatient Prescriptions on File Prior to Visit  Medication Sig Dispense Refill  . acetaminophen (TYLENOL) 500 MG tablet Take 500 mg by mouth every 6 (six) hours as needed. For pain    . ALPRAZolam (XANAX) 0.5 MG  tablet Take 0.5 mg by mouth at bedtime as needed. For sleep    . losartan (COZAAR) 25 MG tablet Take 25 mg by mouth daily.    . methylphenidate (RITALIN) 10 MG tablet Take 10 mg by mouth 3 (three) times daily.    Marland Kitchen. omeprazole (PRILOSEC) 20 MG capsule Take 40 mg by mouth daily.    Marland Kitchen. tretinoin (RETIN-A) 0.05 % cream Apply 1 application topically at bedtime.     No current facility-administered medications on file prior to visit.    Review of Systems:  As per HPI, otherwise negative.    Physical Examination: Filed Vitals:   09/08/14 1831  BP: 120/78  Pulse: 69  Temp: 98.5 F (36.9 C)  Resp: 16   Filed Vitals:   09/08/14 1831  Height: 5' 8.5" (1.74 m)  Weight: 152 lb 8 oz (69.174 kg)   Body mass index is 22.85 kg/(m^2). Ideal Body Weight: Weight in (lb) to have BMI = 25: 166.5   GEN: WDWN, NAD, Non-toxic, Alert & Oriented x 3 HEENT: Atraumatic, Normocephalic.  Ears and Nose: No external deformity. EXTR: No clubbing/cyanosis/edema NEURO: Normal gait.  PSYCH: Normally interactive. Conversant. Not depressed or anxious appearing.  Calm demeanor.    Assessment and Plan: Chest pain Left without treatment  Signed,  Phillips OdorJeffery Arhan Mcmanamon, MD

## 2014-09-08 NOTE — Telephone Encounter (Signed)
Called patient per Dr. Dareen PianoAnderson due to the fact patient left without receiving further care.  Patient was prepared to have an EKG done in the office but she left without notification to the staff.  Left an message for the patient to call the office back.

## 2016-08-30 ENCOUNTER — Ambulatory Visit (INDEPENDENT_AMBULATORY_CARE_PROVIDER_SITE_OTHER): Payer: No Typology Code available for payment source | Admitting: Physician Assistant

## 2016-08-30 VITALS — BP 142/72 | HR 78 | Temp 98.3°F | Resp 16 | Ht 69.0 in | Wt 171.0 lb

## 2016-08-30 DIAGNOSIS — Z23 Encounter for immunization: Secondary | ICD-10-CM

## 2016-08-30 DIAGNOSIS — I10 Essential (primary) hypertension: Secondary | ICD-10-CM

## 2016-08-30 LAB — POCT CBC
GRANULOCYTE PERCENT: 78.4 % (ref 37–80)
HCT, POC: 39.7 % (ref 37.7–47.9)
HEMOGLOBIN: 13.8 g/dL (ref 12.2–16.2)
LYMPH, POC: 1.6 (ref 0.6–3.4)
MCH, POC: 32.5 pg — AB (ref 27–31.2)
MCHC: 34.6 g/dL (ref 31.8–35.4)
MCV: 93.8 fL (ref 80–97)
MID (cbc): 0.5 (ref 0–0.9)
MPV: 7.1 fL (ref 0–99.8)
PLATELET COUNT, POC: 245 10*3/uL (ref 142–424)
POC Granulocyte: 7.7 — AB (ref 2–6.9)
POC LYMPH %: 16.1 % (ref 10–50)
POC MID %: 5.5 %M (ref 0–12)
RBC: 4.24 M/uL (ref 4.04–5.48)
RDW, POC: 12.4 %
WBC: 9.8 10*3/uL (ref 4.6–10.2)

## 2016-08-30 LAB — POCT URINALYSIS DIP (MANUAL ENTRY)
BILIRUBIN UA: NEGATIVE
BILIRUBIN UA: NEGATIVE
Glucose, UA: NEGATIVE
Leukocytes, UA: NEGATIVE
Nitrite, UA: NEGATIVE
PH UA: 7
Protein Ur, POC: NEGATIVE
Spec Grav, UA: 1.015
Urobilinogen, UA: 0.2

## 2016-08-30 MED ORDER — LISINOPRIL-HYDROCHLOROTHIAZIDE 10-12.5 MG PO TABS: 1.0000 | ORAL_TABLET | Freq: Every day | ORAL | 3 refills | Status: DC

## 2016-08-30 NOTE — Patient Instructions (Signed)
     IF you received an x-ray today, you will receive an invoice from [facility]. Please contact [facility] at [phone] with questions or concerns regarding your invoice.   IF you received labwork today, you will receive an invoice from [facility]. Please contact [facility] at [phone] with questions or concerns regarding your invoice.   Our billing staff will not be able to assist you with questions regarding bills from these companies.  You will be contacted with the lab results as soon as they are available. The fastest way to get your results is to activate your My Chart account. Instructions are located on the last page of this paperwork. If you have not heard from us regarding the results in 2 weeks, please contact this office.     

## 2016-08-30 NOTE — Progress Notes (Signed)
08/30/2016 4:27 PM   DOB: [birth year] / MRN: [id number]  SUBJECTIVE:  Kristy Rivas is a 55 y.o. female presenting for "I want a BP medication."  She has taken BP medication in the past. She reports that her home measures are measuring around 160/95. States that her BP was somewhat controlled with her normal Losartan dose of 25 mg daily however her work is driving her stress and her boss is not very nice to her.   She complains of sporadic generalized HA without changes in vision.  She reports occasional chest pain but tells me this is her GERD.  She complains of SOB but tells me she is always this way and she this improves with xanax.  She has not had any work up since 2013.   Tells me that she has quit smoking. She sees psychiatry for depression and anxiety.  She has No Known Allergies.   She  has a past medical history of Anxiety; Anxiety and depression; Depression; GERD (gastroesophageal reflux disease); and Hypertension.    She  reports that she has quit smoking. She smoked 0.50 packs per day. She has never used smokeless tobacco. She reports that she does not drink alcohol or use drugs. She  has no sexual activity history on file. The patient  has a past surgical history that includes Finger surgery.    Review of Systems  Constitutional: Negative for fever.  Respiratory: Negative for cough and shortness of breath.   Cardiovascular: Negative for orthopnea and leg swelling.  Skin: Negative for rash.    The problem list and medications were reviewed and updated by myself where necessary and exist elsewhere in the encounter.   OBJECTIVE:  BP (!) 142/72 (BP Location: Right Arm, Patient Position: Sitting, Cuff Size: Normal)   Pulse 78   Temp 98.3 F (36.8 C)   Resp 16   Ht _0  (1.753 m)   Wt 171 lb (77.6 kg)   LMP 08/29/2016   SpO2 100%   BMI 25.25 kg/m   Physical Exam  Constitutional: She is oriented to person, place, and time.  Cardiovascular: Normal rate and  regular rhythm.   Pulmonary/Chest: Effort normal and breath sounds normal.  Musculoskeletal: Normal range of motion.  Neurological: She is alert and oriented to person, place, and time.    Results for orders placed or performed in visit on 08/30/16 (from the past 72 hour(s))  POCT urinalysis dipstick     Status: Abnormal   Collection Time: 08/30/16  4:09 PM  Result Value Ref Range   Color, UA yellow yellow   Clarity, UA clear clear   Glucose, UA negative negative   Bilirubin, UA negative negative   Ketones, POC UA negative negative   Spec Grav, UA 1.015    Blood, UA trace-intact (A) negative   pH, UA 7.0    Protein Ur, POC negative negative   Urobilinogen, UA 0.2    Nitrite, UA Negative Negative   Leukocytes, UA Negative Negative  POCT CBC     Status: Abnormal   Collection Time: 08/30/16  4:10 PM  Result Value Ref Range   WBC 9.8 4.6 - 10.2 K/uL   Lymph, poc 1.6 0.6 - 3.4   POC LYMPH PERCENT 16.1 10 - 50 %L   MID (cbc) 0.5 0 - 0.9   POC MID % 5.5 0 - 12 %M   POC Granulocyte 7.7 (A) 2 - 6.9   Granulocyte percent 78.4 37 - 80 %G   RBC  4.24 4.04 - 5.48 M/uL   Hemoglobin 13.8 12.2 - 16.2 g/dL   HCT, POC 39.7 37.7 - 47.9 %   MCV 93.8 80 - 97 fL   MCH, POC 32.5 (A) 27 - 31.2 pg   MCHC 34.6 31.8 - 35.4 g/dL   RDW, POC 12.4 %   Platelet Count, POC 245 142 - 424 K/uL   MPV 7.1 0 - 99.8 fL    No results found.  ASSESSMENT AND PLAN  Kristy Rivas was seen today for medication refill.  Diagnoses and all orders for this visit:  Essential hypertension: Her EKG is normal and reassuring and her work up is unremarkable.I don't see any evidence of end organ damage due to elevated BP.  Given her ambulatory measures I am going to increase her to a dual agent which she tells me she has had before. She needs a physical and advised that she come back in 6 months for this.   -     EKG 12-Lead -     POCT urinalysis dipstick -     POCT CBC -     CMP14+EGFR -      lisinopril-hydrochlorothiazide (PRINZIDE,ZESTORETIC) 10-12.5 MG tablet; Take 1 tablet by mouth daily.  Need for influenza vaccination -     Flu Vaccine QUAD 36+ mos IM    The patient is advised to call or return to clinic if she does not see an improvement in symptoms, or to seek the care of the closest emergency department if she worsens with the above plan.   Philis Fendt, MHS, PA-C [facility] 08/30/2016 4:27 PM

## 2016-08-31 LAB — CMP14+EGFR
A/G RATIO: 1.5 (ref 1.2–2.2)
ALT: 22 IU/L (ref 0–32)
AST: 20 IU/L (ref 0–40)
Albumin: 4.3 g/dL (ref 3.5–5.5)
Alkaline Phosphatase: 79 IU/L (ref 39–117)
BUN / CREAT RATIO: 20 (ref 9–23)
BUN: 14 mg/dL (ref 6–24)
Bilirubin Total: 0.3 mg/dL (ref 0.0–1.2)
CALCIUM: 9.3 mg/dL (ref 8.7–10.2)
CO2: 28 mmol/L (ref 18–29)
Chloride: 99 mmol/L (ref 96–106)
Creatinine, Ser: 0.69 mg/dL (ref 0.57–1.00)
GFR, EST AFRICAN AMERICAN: 114 mL/min/{1.73_m2} (ref >59)
GFR, EST NON AFRICAN AMERICAN: 99 mL/min/{1.73_m2} (ref >59)
GLOBULIN, TOTAL: 2.9 g/dL (ref 1.5–4.5)
Glucose: 93 mg/dL (ref 65–99)
POTASSIUM: 3.7 mmol/L (ref 3.5–5.2)
SODIUM: 140 mmol/L (ref 134–144)
TOTAL PROTEIN: 7.2 g/dL (ref 6.0–8.5)

## 2016-09-02 ENCOUNTER — Telehealth: Payer: Self-pay

## 2016-09-02 MED ORDER — DESVENLAFAXINE SUCCINATE ER 100 MG PO TB24: 100.0000 mg | ORAL_TABLET | Freq: Every day | ORAL | 0 refills | Status: DC

## 2016-09-02 NOTE — Telephone Encounter (Signed)
Pt called back and now states she got the on call from psych on call and they said they dont refill on the weekends She uses the patient assistance plan for free meds so has no record at a local pharmacy that she has ever had this med. She states she needs 5 pills to get her til Tuesday.  I tried to call [phone] as well, and the a/m does state this but states if emergency call on call.

## 2016-09-02 NOTE — Telephone Encounter (Signed)
Meds ordered this encounter  Medications  . desvenlafaxine (PRISTIQ) 100 MG 24 hr tablet    Sig: Take 1 tablet (100 mg total) by mouth daily.    Dispense:  5 tablet    Refill:  0    Order Specific Question:   Supervising Provider    Answer:   Clelia CroftSHAW, EVA N [[id number]]

## 2016-09-02 NOTE — Telephone Encounter (Signed)
Pt called because she will be 5 days short pristique until she sees her new psychiatrist in the same office as her old psych 09/05/16. I advised she call the office and reach the psychiatrist on call for a med refill, call us back if no response. We have seen her in 08/2016 but never for this med.

## 2016-09-05 NOTE — Telephone Encounter (Signed)
Pharm reported that they have filled the Rx and automated message goes out to the patient.

## 2016-12-08 ENCOUNTER — Ambulatory Visit (INDEPENDENT_AMBULATORY_CARE_PROVIDER_SITE_OTHER): Payer: PRIVATE HEALTH INSURANCE | Admitting: Physician Assistant

## 2016-12-08 VITALS — BP 141/88 | HR 108 | Temp 98.6°F | Resp 18 | Ht 69.0 in | Wt 165.0 lb

## 2016-12-08 DIAGNOSIS — F341 Dysthymic disorder: Secondary | ICD-10-CM | POA: Diagnosis not present

## 2016-12-08 DIAGNOSIS — I10 Essential (primary) hypertension: Secondary | ICD-10-CM | POA: Diagnosis not present

## 2016-12-08 MED ORDER — LISINOPRIL-HYDROCHLOROTHIAZIDE 20-12.5 MG PO TABS: 1.0000 | ORAL_TABLET | Freq: Every day | ORAL | 3 refills | Status: DC

## 2016-12-08 MED ORDER — ALPRAZOLAM 0.5 MG PO TABS: 0.5000 mg | ORAL_TABLET | Freq: Every evening | ORAL | 0 refills | Status: AC | PRN

## 2016-12-08 MED ORDER — DESVENLAFAXINE SUCCINATE ER 100 MG PO TB24
100.0000 mg | ORAL_TABLET | Freq: Every day | ORAL | 4 refills | Status: DC
Start: 2016-12-08 — End: 2017-05-12

## 2016-12-08 NOTE — Progress Notes (Signed)
12/13/2016 8:18 AM   DOB: 1961-07-21 / MRN: [id number]  SUBJECTIVE:  Kristy Rivas is a 56 y.o. female presenting for refills of pristic. She would like some Xanax if possible.  She will be seeing Dr. Westley Chandler in psychiatry in the middle of June for follow up. She has tried to commit suicide a few years ago after her father passed.  She rarely drinks now.    She takes Xanax nightly essentially to help her sleep.  She has tried Ambien in the past as this caused her to sleep walk.   She also takes Ritalin daily on most days for ADD. She also receives these prescriptions from psychiatry.   She is compliant with BP therapy.    Depression screen PHQ 2/9 12/08/2016  Decreased Interest 0  Down, Depressed, Hopeless 0  PHQ - 2 Score 0     She has No Known Allergies.   She  has a past medical history of Anxiety; Anxiety and depression; Depression; GERD (gastroesophageal reflux disease); and Hypertension.    She  reports that she has quit smoking. She smoked 0.50 packs per day. She has never used smokeless tobacco. She reports that she does not drink alcohol or use drugs. She  has no sexual activity history on file. The patient  has a past surgical history that includes Finger surgery.  Her family history is not on file.  Review of Systems  Constitutional: Negative for chills, diaphoresis and fever.  Eyes: Negative.   Respiratory: Negative for cough, hemoptysis, sputum production, shortness of breath and wheezing.   Cardiovascular: Negative for chest pain, orthopnea and leg swelling.  Gastrointestinal: Negative for nausea.  Skin: Negative for rash.  Neurological: Negative for dizziness, sensory change, speech change, focal weakness and headaches.    The problem list and medications were reviewed and updated by myself where necessary and exist elsewhere in the encounter.   OBJECTIVE:  BP (!) 141/88   Pulse (!) 108   Temp 98.6 F (37 C) (Oral)   Resp 18   Ht  (1.753 m)   Wt 165 lb  (74.8 kg)   LMP 10/10/2016   SpO2 100%   BMI 24.37 kg/m   BP Readings from Last 3 Encounters:  12/08/16 (!) 141/88  08/30/16 (!) 142/72  09/08/14 120/78     Physical Exam  Constitutional: She is oriented to person, place, and time. She is active.  Non-toxic appearance.  Eyes: EOM are normal. Pupils are equal, round, and reactive to light.  Cardiovascular: Normal rate, regular rhythm, S1 normal, S2 normal, normal heart sounds and intact distal pulses.  Exam reveals no gallop, no friction rub and no decreased pulses.   No murmur heard. Pulmonary/Chest: Effort normal. No stridor. No tachypnea. No respiratory distress. She has no wheezes. She has no rales.  Abdominal: She exhibits no distension.  Musculoskeletal: She exhibits no edema.  Neurological: She is alert and oriented to person, place, and time. She has normal strength and normal reflexes. She is not disoriented. She displays no atrophy. No cranial nerve deficit or sensory deficit. She exhibits normal muscle tone. Coordination and gait normal.  Skin: Skin is warm and dry. She is not diaphoretic. No pallor.  Psychiatric: She has a normal mood and affect. Her behavior is normal. Judgment and thought content normal.    No results found for this or any previous visit (from the past 72 hour(s)).  No results found.  ASSESSMENT AND PLAN:  Kristy Rivas was seen today for medication  refill.  Diagnoses and all orders for this visit:  Dysthymia: I will be happy to carry her presitq until she is seen by psychiatry.  Advised that I will not be carrying her xanax.  She will try melatonin for sleep and use xanax only for panic in evening.  -     desvenlafaxine (PRISTIQ) 100 MG 24 hr tablet; Take 1 tablet (100 mg total) by mouth daily. -     ALPRAZolam (XANAX) 0.5 MG tablet; Take 1 tablet (0.5 mg total) by mouth at bedtime as needed (For panic symptoms only.).  Essential hypertension, benign: Increasing her dose of lisinopril.  Will see her  back in six months.  -     lisinopril-hydrochlorothiazide (ZESTORETIC) 20-12.5 MG tablet; Take 1 tablet by mouth daily.    The patient is advised to call or return to clinic if she does not see an improvement in symptoms, or to seek the care of the closest emergency department if she worsens with the above plan.   [name], MHS, PA-C [facility] 12/13/2016 8:18 AM

## 2016-12-08 NOTE — Patient Instructions (Addendum)
Try taking your hydroxyzine and/or melatonin 3 mg nightly.  Take alprazolam for panic symptoms only.  Good luck in follow up with your new psychatrist.  I am happy to carry your prescription of pristiq as long as you need it.   Keep an eye on your BP.  If your pressures are consistently greater than 140/90 then come back into clinic.      IF you received an x-ray today, you will receive an invoice from [facility]. Please contact [facility] at [phone] with questions or concerns regarding your invoice.   IF you received labwork today, you will receive an invoice from [facility]. Please contact [facility] at [phone] with questions or concerns regarding your invoice.   Our billing staff will not be able to assist you with questions regarding bills from these companies.  You will be contacted with the lab results as soon as they are available. The fastest way to get your results is to activate your My Chart account. Instructions are located on the last page of this paperwork. If you have not heard from [place] regarding the results in 2 weeks, please contact this office.

## 2017-03-10 ENCOUNTER — Telehealth: Payer: Self-pay | Admitting: Physician Assistant

## 2017-03-10 NOTE — Telephone Encounter (Signed)
Pt is calling to check on the status of her BP med refill.  She is currently out of lisinopril but at the last visit with Chestine Sporelark, he stated that he wanted to try a new medication once her lisinopril ran out.  [facility]. Please advise [phone]

## 2017-03-12 NOTE — Telephone Encounter (Signed)
How's her pressure. I increased her medication last time I was her to a higher dose of lisinopril and it looks like I wrote her for a year. Please check with the pharmacy and see if there's medication waiting that she is unaware of.

## 2017-03-12 NOTE — Telephone Encounter (Signed)
Please advise 

## 2017-03-14 NOTE — Telephone Encounter (Signed)
Call placed to patient for clarification of refill request. No answer on patient phone, left message for patient to return call to this office./ S.Avryl Roehm,CMA

## 2017-03-23 NOTE — Telephone Encounter (Signed)
Placed second call to patient regarding medications. Patient states that she spoke with pharmacy and obtained rx that was pending for her. Patient thanked office for the assistance./ S.Camari Wisham,CMA

## 2017-05-12 ENCOUNTER — Other Ambulatory Visit: Payer: Self-pay | Admitting: Physician Assistant

## 2017-05-12 DIAGNOSIS — F341 Dysthymic disorder: Secondary | ICD-10-CM

## 2018-03-12 ENCOUNTER — Other Ambulatory Visit: Payer: Self-pay | Admitting: Physician Assistant

## 2018-03-12 DIAGNOSIS — I10 Essential (primary) hypertension: Secondary | ICD-10-CM

## 2018-03-13 NOTE — Telephone Encounter (Signed)
Lisinopril/HCTZ 20-12.5 mg refill [facility] 12/08/16; #90; RF x 3  Last OV 12/08/16  PCP M. Clark  Refill x 30 days; needs office visit ; Per protocol

## 2018-04-03 ENCOUNTER — Ambulatory Visit: Payer: Self-pay | Admitting: Physician Assistant

## 2018-04-03 ENCOUNTER — Encounter: Payer: Self-pay | Admitting: Physician Assistant

## 2018-04-03 VITALS — BP 128/76 | HR 80 | Temp 98.7°F | Resp 16 | Ht 69.0 in | Wt 164.6 lb

## 2018-04-03 DIAGNOSIS — L03113 Cellulitis of right upper limb: Secondary | ICD-10-CM

## 2018-04-03 DIAGNOSIS — I10 Essential (primary) hypertension: Secondary | ICD-10-CM

## 2018-04-03 DIAGNOSIS — W5503XA Scratched by cat, initial encounter: Secondary | ICD-10-CM

## 2018-04-03 MED ORDER — AZITHROMYCIN 250 MG PO TABS: ORAL_TABLET | ORAL | 0 refills | Status: AC

## 2018-04-03 MED ORDER — LISINOPRIL-HYDROCHLOROTHIAZIDE 20-12.5 MG PO TABS: 1.0000 | ORAL_TABLET | Freq: Every day | ORAL | 1 refills | Status: DC

## 2018-04-03 MED ORDER — NAPROXEN 500 MG PO TABS: 500.0000 mg | ORAL_TABLET | Freq: Two times a day (BID) | ORAL | 0 refills | Status: DC

## 2018-04-03 MED ORDER — AMOXICILLIN-POT CLAVULANATE 875-125 MG PO TABS: 1.0000 | ORAL_TABLET | Freq: Two times a day (BID) | ORAL | 0 refills | Status: AC

## 2018-04-03 NOTE — Progress Notes (Signed)
04/09/2018 12:18 PM   DOB: 11/06/1960 / MRN: [id number]  SUBJECTIVE:  Kristy Rivas is a 57 y.o. female presenting for catch scratch and subsequent arm pain. Symptoms present for about 2 days.  The problem is worsening. She has tried nothing.   Immunization History  Administered Date(s) Administered  . Influenza,inj,Quad PF,6+ Mos 08/30/2016  . Td 08/24/2008  . Tdap 12/27/2013     She is allergic to latex.   She  has a past medical history of Anxiety, Anxiety and depression, Depression, GERD (gastroesophageal reflux disease), and Hypertension.    She  reports that she has quit smoking. She smoked 0.50 packs per day. She has never used smokeless tobacco. She reports that she does not drink alcohol or use drugs. She  has no sexual activity history on file. The patient  has a past surgical history that includes Finger surgery.  Her family history is not on file.  Review of Systems  Constitutional: Negative for chills, diaphoresis and fever.  Eyes: Negative.   Respiratory: Negative for cough, hemoptysis, sputum production, shortness of breath and wheezing.   Cardiovascular: Negative for chest pain, orthopnea and leg swelling.  Gastrointestinal: Negative for abdominal pain, blood in stool, constipation, diarrhea, heartburn, melena, nausea and vomiting.  Genitourinary: Negative for dysuria, flank pain, frequency, hematuria and urgency.  Skin: Negative for rash.  Neurological: Negative for dizziness, sensory change, speech change, focal weakness and headaches.    The problem list and medications were reviewed and updated by myself where necessary and exist elsewhere in the encounter.   OBJECTIVE:  BP 128/76   Pulse 80   Temp 98.7 F (37.1 C)   Resp 16   Ht 5\' 9"  (1.753 m)   Wt 164 lb 9.6 oz (74.7 kg)   SpO2 100%   BMI 24.31 kg/m   Wt Readings from Last 3 Encounters:  04/05/18 166 lb 9.6 oz (75.6 kg)  04/03/18 164 lb 9.6 oz (74.7 kg)  12/08/16 165 lb (74.8 kg)    Temp Readings from Last 3 Encounters:  04/05/18 98.6 F (37 C) (Oral)  04/03/18 98.7 F (37.1 C)  12/08/16 98.6 F (37 C) (Oral)   BP Readings from Last 3 Encounters:  04/05/18 (!) 149/82  04/03/18 128/76  12/08/16 (!) 141/88   Pulse Readings from Last 3 Encounters:  04/05/18 90  04/03/18 80  12/08/16 (!) 108    Physical Exam  Constitutional: She is oriented to person, place, and time. She appears well-nourished. No distress.  Eyes: Pupils are equal, round, and reactive to light. EOM are normal.  Cardiovascular: Normal rate, regular rhythm, S1 normal, S2 normal, normal heart sounds and intact distal pulses. Exam reveals no gallop, no friction rub and no decreased pulses.  No murmur heard. Pulses:      Radial pulses are 2+ on the right side.  Pulmonary/Chest: Effort normal. No stridor. No respiratory distress. She has no wheezes. She has no rales.  Abdominal: She exhibits no distension.  Musculoskeletal: She exhibits no edema.       Right wrist: She exhibits tenderness and swelling. She exhibits normal range of motion, no bony tenderness, no effusion, no crepitus, no deformity and no laceration.  Neurological: She is alert and oriented to person, place, and time. She has normal strength. No cranial nerve deficit or sensory deficit. Gait normal.  Skin: Skin is dry. Rash noted. She is not diaphoretic.  Psychiatric: She has a normal mood and affect.  Vitals reviewed.  No results found for: HGBA1C  Lab Results  Component Value Date   WBC 9.8 08/30/2016   HGB 13.8 08/30/2016   HCT 39.7 08/30/2016   MCV 93.8 08/30/2016   PLT 208 02/08/2012    Lab Results  Component Value Date   CREATININE 0.69 08/30/2016   BUN 14 08/30/2016   NA 140 08/30/2016   K 3.7 08/30/2016   CL 99 08/30/2016   CO2 28 08/30/2016    Lab Results  Component Value Date   ALT 22 08/30/2016   AST 20 08/30/2016   ALKPHOS 79 08/30/2016   BILITOT 0.3 08/30/2016    Lab Results   Component Value Date   TSH 2.028 05/19/2010    No results found for: CHOL, HDL, LDLCALC, LDLDIRECT, TRIG, CHOLHDL   ASSESSMENT AND PLAN:  Kristy Rivas was seen today for cat scratch.  Diagnoses and all orders for this visit:  Cellulitis of right upper extremity Comments: 2/2 problem 2.  Case discussed with Dr. Orlan Leavens in hand who recomends Augmentin, Azithro, spint with close follow up. She will come back in 2 days.  Orders: -     amoxicillin-clavulanate (AUGMENTIN) 875-125 MG tablet; Take 1 tablet by mouth 2 (two) times daily for 10 days. Take with food. -     azithromycin (ZITHROMAX) 250 MG tablet; Take two tabs on day on and one daily thereafter. -     naproxen (NAPROSYN) 500 MG tablet; Take 1 tablet (500 mg total) by mouth 2 (two) times daily with a meal.  Cat scratch  Essential hypertension, benign Comments: Well controlled.  Needs labs. She agrees to come back for that in a few weeks.  Orders: -     lisinopril-hydrochlorothiazide (PRINZIDE,ZESTORETIC) 20-12.5 MG tablet; Take 1 tablet by mouth daily. Please come back for a bp visit soon.  Other orders -     Discontinue: naproxen (NAPROSYN) 500 MG tablet; Take 1 tablet (500 mg total) by mouth 2 (two) times daily with a meal.    The patient is advised to call or return to clinic if she does not see an improvement in symptoms, or to seek the care of the closest emergency department if she worsens with the above plan.   [name], MHS, PA-C [facility] 04/09/2018 12:18 PM

## 2018-04-03 NOTE — Patient Instructions (Addendum)
Come back in about 2 days for recheck. Please come back as quickly as possible for a HTN visit in a time that you are well.  I have written you for six months of your medication to give you time to get back in.

## 2018-04-05 ENCOUNTER — Ambulatory Visit (INDEPENDENT_AMBULATORY_CARE_PROVIDER_SITE_OTHER): Payer: Self-pay | Admitting: Physician Assistant

## 2018-04-05 ENCOUNTER — Encounter: Payer: Self-pay | Admitting: Physician Assistant

## 2018-04-05 ENCOUNTER — Other Ambulatory Visit: Payer: Self-pay

## 2018-04-05 VITALS — BP 149/82 | HR 90 | Temp 98.6°F | Resp 16 | Ht 68.5 in | Wt 166.6 lb

## 2018-04-05 DIAGNOSIS — W5503XD Scratched by cat, subsequent encounter: Secondary | ICD-10-CM

## 2018-04-05 DIAGNOSIS — S50811D Abrasion of right forearm, subsequent encounter: Secondary | ICD-10-CM

## 2018-04-05 NOTE — Progress Notes (Signed)
Kristy Rivas  MRN: [id number] DOB: 09-Jun-1961  PCP: Ofilia Neas, PA-C  Subjective:  Pt presents for follow-up wound S/P cat scratch of her right forearm.  She was here on 7/31 for this problem and prescribed amoxicillin-clavulanate, azithromycin, naproxen.  She is taking medications as prescribed.  She is not missing a dose.  Naproxen is helping her pain. Today she states she is doing much better. Continues to wear the wrist brace, which is helping.   Swelling, redness, "hardness" is much better.  She still endorses some pain with range of motion of her right wrist, but no joint pain. Review of systems below  Review of Systems  Constitutional: Negative for chills, diaphoresis, fatigue and fever.  Musculoskeletal: Positive for myalgias. Negative for arthralgias, joint swelling, neck pain and neck stiffness.  Skin: Positive for wound.    Patient Active Problem List   Diagnosis Date Noted  . CYSTIC ACNE 01/12/2009  . Depression (emotion) 11/11/2008  . 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 OTHER SPECIFIED CONDITIONS OF THE TONGUE 11/11/2008  . DRUG ABUSE, HX OF 11/11/2008    Current Outpatient Medications on File Prior to Visit  Medication Sig Dispense Refill  . acetaminophen (TYLENOL) 500 MG tablet Take 500 mg by mouth every 6 (six) hours as needed. For pain    . ALPRAZolam (XANAX) 0.5 MG tablet Take 1 tablet (0.5 mg total) by mouth at bedtime as needed (For panic symptoms only.). 7 tablet 0  . amoxicillin-clavulanate (AUGMENTIN) 875-125 MG tablet Take 1 tablet by mouth 2 (two) times daily for 10 days. Take with food. 20 tablet 0  . azithromycin (ZITHROMAX) 250 MG tablet Take two tabs on day on and one daily thereafter. 6 tablet 0  . Brexpiprazole (REXULTI) 1 MG TABS Take 1 mg by mouth daily.    Marland Kitchen desvenlafaxine (PRISTIQ) 100 MG 24 hr tablet TAKE 1 TABLET BY MOUTH ONCE DAILY 30 tablet 2  . lisinopril-hydrochlorothiazide (PRINZIDE,ZESTORETIC) 20-12.5 MG tablet  Take 1 tablet by mouth daily. Please come back for a bp visit soon. 90 tablet 1  . methylphenidate (RITALIN) 10 MG tablet Take 10 mg by mouth 3 (three) times daily.    . naproxen (NAPROSYN) 500 MG tablet Take 1 tablet (500 mg total) by mouth 2 (two) times daily with a meal. 30 tablet 0  . omeprazole (PRILOSEC) 20 MG capsule Take 40 mg by mouth daily.    Marland Kitchen tretinoin (RETIN-A) 0.05 % cream Apply 1 application topically at bedtime.     No current facility-administered medications on file prior to visit.     Allergies  Allergen Reactions  . Latex Rash     Objective:  BP (!) 149/82 (BP Location: Right Arm)   Pulse 90   Temp 98.6 F (37 C) (Oral)   Resp 16   Ht 5' 8.5" (1.74 m)   Wt 166 lb 9.6 oz (75.6 kg)   LMP 10/10/2016   SpO2 100%   BMI 24.96 kg/m   Physical Exam  Constitutional: She is oriented to person, place, and time. No distress.  Neurological: She is alert and oriented to person, place, and time.  Skin: Skin is warm and dry. Abrasion noted.     3 small puncture wounds posterior right forearm.  No drainage, cellulitis.  area is still warm and a little firm however no induration.  Pain of her anterior forearm with right wrist flexion.  No joint pain.   Psychiatric: Judgment normal.  Vitals reviewed.   Assessment  and Plan :  1. Cat scratch of right forearm, subsequent encounter -Patient presents for follow-up cat scratch.  Clinically she is improving.She was here on 7/31 for this problem and prescribed amoxicillin-clavulanate, azithromycin, naproxen.  Denies medication side effects.  Advised patient continue taking antibiotics as prescribed and continue wearing wrist brace for another 5 to 7 days.  Return to clinic if symptoms worsen or fail to improve.  Marco CollieWhitney Timi Reeser, PA-C  [facility] at [facility] 04/05/2018 3:51 PM  Please note: Portions of this report may have been transcribed using dragon voice recognition software. Every effort was made to  ensure accuracy; however, inadvertent computerized transcription errors may be present.

## 2018-04-05 NOTE — Patient Instructions (Signed)
     IF you received an x-ray today, you will receive an invoice from [facility]. Please contact  Radiology at [phone] with questions or concerns regarding your invoice.   IF you received labwork today, you will receive an invoice from [facility]. Please contact [facility] at [phone] with questions or concerns regarding your invoice.   Our billing staff will not be able to assist you with questions regarding bills from these companies.  You will be contacted with the lab results as soon as they are available. The fastest way to get your results is to activate your My Chart account. Instructions are located on the last page of this paperwork. If you have not heard from us regarding the results in 2 weeks, please contact this office.     

## 2018-07-01 ENCOUNTER — Other Ambulatory Visit (HOSPITAL_COMMUNITY): Payer: Self-pay | Admitting: *Deleted

## 2018-07-01 DIAGNOSIS — Z1231 Encounter for screening mammogram for malignant neoplasm of breast: Secondary | ICD-10-CM

## 2018-08-13 ENCOUNTER — Ambulatory Visit
Admission: RE | Admit: 2018-08-13 | Discharge: 2018-08-13 | Disposition: A | Discharge status: Home / Self Care | Payer: No Typology Code available for payment source | Source: Ambulatory Visit | Attending: Obstetrics and Gynecology | Admitting: Obstetrics and Gynecology

## 2018-08-13 ENCOUNTER — Ambulatory Visit (HOSPITAL_COMMUNITY)
Admission: RE | Admit: 2018-08-13 | Discharge: 2018-08-13 | Disposition: A | Discharge status: Home / Self Care | Payer: Self-pay | Source: Ambulatory Visit | Attending: Obstetrics and Gynecology | Admitting: Obstetrics and Gynecology

## 2018-08-13 ENCOUNTER — Encounter (HOSPITAL_COMMUNITY): Payer: Self-pay | Admitting: *Deleted

## 2018-08-13 ENCOUNTER — Encounter (HOSPITAL_COMMUNITY): Payer: Self-pay

## 2018-08-13 VITALS — BP 120/74 | Ht 69.0 in | Wt 173.0 lb

## 2018-08-13 DIAGNOSIS — Z1231 Encounter for screening mammogram for malignant neoplasm of breast: Secondary | ICD-10-CM

## 2018-08-13 DIAGNOSIS — Z01419 Encounter for gynecological examination (general) (routine) without abnormal findings: Secondary | ICD-10-CM

## 2018-08-13 NOTE — Progress Notes (Signed)
No complaints today.   Pap Smear: Pap smear completed today. Last Pap smear was 08/25/2007 and normal. Per patient has no history of an abnormal Pap smear. Last Pap smear result is in Epic.  Physical exam: Breasts Breasts symmetrical. No skin abnormalities bilateral breasts. No nipple retraction bilateral breasts. No nipple discharge bilateral breasts. No lymphadenopathy. No lumps palpated bilateral breasts. No complaints of pain or tenderness on exam. [facility] for a screening mammogram. Appointment scheduled for Tuesday, August 13, 2018 at 1130.        Pelvic/Bimanual   Ext Genitalia No lesions, no swelling and no discharge observed on external genitalia.         Vagina Vagina pink and normal texture. No lesions or discharge observed in vagina.          Cervix Cervix is present. Cervix pink and of normal texture. No discharge observed.     Uterus Uterus is present and palpable. Uterus in normal position and normal size.        Adnexae Bilateral ovaries present and palpable. No tenderness on palpation.         Rectovaginal No rectal exam completed today since patient had no rectal complaints. No skin abnormalities observed on exam.    Smoking History: Patient is a current smoker. Discussed smoking cessation with patient. Referred to the [facility] Q[facility] and gave resources to the free smoking cessation classes at [facility].  Patient Navigation: Patient education provided. Access to services provided for patient through [facility] program.   Colorectal Cancer Screening: Per patient has never had a colonoscopy completed. No complaints today. FIT Test given to patient to complete and return to [facility].  Breast and Cervical Cancer Risk Assessment: Patient has no family history of breast cancer, known genetic mutations, or radiation treatment to the chest before age 56. Patient has no history of cervical dysplasia, immunocompromised, or DES exposure  in-utero.  Risk Assessment    Risk Scores      08/13/2018   Last edited by: Lynnell DikeHolland, Sabrina H, LPN   5-year risk: 1.1 %   Lifetime risk: 7.2 %

## 2018-08-13 NOTE — Patient Instructions (Signed)
Explained breast self awareness with Kristy Rivas. Let patient know [facility] will cover Pap smears and HPV typing every 5 years unless has a history of abnormal Pap smears. [facility] for a screening mammogram. Appointment scheduled for Tuesday, August 13, 2018 at 1130. Patient aware of appointment and will be there. Let patient know will follow up with her within the next couple weeks with results of Pap smear by letter or phone. Informed patient that the Breast Center will follow-up with her within the next couple of weeks with results of mammogram by letter or phone. Discussed smoking cessation with patient. Referred to the [facility] Quitline and gave resources to the free smoking cessation classes at [facility]. Kristy Rivas verbalized understanding.  Abrahm Mancia, Kathaleen Maserhristine Poll, RN 12:02 PM

## 2018-08-16 LAB — CYTOLOGY - PAP: HPV: NOT DETECTED

## 2018-08-20 ENCOUNTER — Other Ambulatory Visit: Payer: Self-pay

## 2018-08-22 ENCOUNTER — Telehealth (HOSPITAL_COMMUNITY): Payer: Self-pay | Admitting: *Deleted

## 2018-08-22 NOTE — Telephone Encounter (Signed)
Telephoned patient at home number and advised patient of abnormal pap smear results. Advised patient would need colposcopy. Explained procedure to patient gave appointment to patient at [facility] on January 17 8:55 am. Patient voiced understanding and no further questions.

## 2018-08-28 LAB — FECAL OCCULT BLOOD, IMMUNOCHEMICAL: Fecal Occult Bld: NEGATIVE

## 2018-08-30 ENCOUNTER — Encounter (HOSPITAL_COMMUNITY): Payer: Self-pay

## 2018-09-20 ENCOUNTER — Other Ambulatory Visit (HOSPITAL_COMMUNITY)
Admission: RE | Admit: 2018-09-20 | Discharge: 2018-09-20 | Disposition: A | Discharge status: Home / Self Care | Payer: No Typology Code available for payment source | Source: Ambulatory Visit | Attending: Obstetrics and Gynecology | Admitting: Obstetrics and Gynecology

## 2018-09-20 ENCOUNTER — Encounter: Payer: Self-pay | Admitting: Obstetrics and Gynecology

## 2018-09-20 ENCOUNTER — Ambulatory Visit (INDEPENDENT_AMBULATORY_CARE_PROVIDER_SITE_OTHER): Payer: Self-pay | Admitting: Obstetrics and Gynecology

## 2018-09-20 ENCOUNTER — Encounter: Payer: Self-pay | Admitting: *Deleted

## 2018-09-20 DIAGNOSIS — R87612 Low grade squamous intraepithelial lesion on cytologic smear of cervix (LGSIL): Secondary | ICD-10-CM | POA: Insufficient documentation

## 2018-09-20 HISTORY — DX: Low grade squamous intraepithelial lesion on cytologic smear of cervix (LGSIL): R87.612

## 2018-09-20 LAB — POCT PREGNANCY, URINE: Preg Test, Ur: NEGATIVE

## 2018-09-20 NOTE — Progress Notes (Signed)
Elevated phq9 , offered to see [facility]- she declines and states has a psychiatrist , but I informed her she could make appointment with our [facility] if she would like.

## 2018-09-20 NOTE — Progress Notes (Signed)
Patient ID: Shavonta Zamboni, female   DOB: February 12, 1961, 58 y.o.   MRN: [id number]    GYNECOLOGY CLINIC COLPOSCOPY PROCEDURE NOTE  58 y.o. [id number] here for colposcopy for LGSIL pap smear on 12/19.  Patient given informed consent, signed copy in the chart, time out was performed.  Placed in lithotomy position. Cervix viewed with speculum and colposcope after application of acetic acid.   Colposcopy adequate? Yes  no visible lesions;  biopsies obtained at 12 & 6 o'clock.  ECC specimen obtained. All specimens were labelled and sent to pathology. Monsel's applied.   Patient was given post procedure instructions.  Will follow up pathology and manage accordingly.  Routine preventative health maintenance measures emphasized.    Nettie Elm, MD, FACOG Attending Obstetrician & Gynecologist Center for [facility]

## 2018-09-20 NOTE — Patient Instructions (Signed)
Colposcopy, Care After  This sheet gives you information about how to care for yourself after your procedure. Your doctor may also give you more specific instructions. If you have problems or questions, contact your doctor.  What can I expect after the procedure?  If you did not have a tissue sample removed (did not have a biopsy), you may only have some spotting for a few days. You can go back to your normal activities.  If you had a tissue sample removed, it is common to have:   Soreness and pain. This may last for a few days.   Light-headedness.   Mild bleeding from your vagina or dark-colored, grainy discharge from your vagina. This may last for a few days. You may need to wear a sanitary pad.   Spotting for at least 48 hours after the procedure.  Follow these instructions at home:     Take over-the-counter and prescription medicines only as told by your doctor. Ask your doctor what medicines you can start taking again. This is very important if you take blood-thinning medicine.   Do not drive or use heavy machinery while taking prescription pain medicine.   For 3 days, or as long as your doctor tells you, avoid:  ? Douching.  ? Using tampons.  ? Having sex.   If you use birth control (contraception), keep using it.   Limit activity for the first day after the procedure. Ask your doctor what activities are safe for you.   It is up to you to get the results of your procedure. Ask your doctor when your results will be ready.   Keep all follow-up visits as told by your doctor. This is important.  Contact a doctor if:   You get a skin rash.  Get help right away if:   You are bleeding a lot from your vagina. It is a lot of bleeding if you are using more than one pad an hour for 2 hours in a row.   You have clumps of blood (blood clots) coming from your vagina.   You have a fever.   You have chills   You have pain in your lower belly (pelvic area).   You have signs of infection, such as vaginal  discharge that is:  ? Different than usual.  ? Yellow.  ? Bad-smelling.   You have very pain or cramps in your lower belly that do not get better with medicine.   You feel light-headed.   You feel dizzy.   You pass out (faint).  Summary   If you did not have a tissue sample removed (did not have a biopsy), you may only have some spotting for a few days. You can go back to your normal activities.   If you had a tissue sample removed, it is common to have mild pain and spotting for 48 hours.   For 3 days, or as long as your doctor tells you, avoid douching, using tampons and having sex.   Get help right away if you have bleeding, very bad pain, or signs of infection.  This information is not intended to replace advice given to you by your health care provider. Make sure you discuss any questions you have with your health care provider.  Document Released: 02/07/2008 Document Revised: 05/10/2016 Document Reviewed: 05/10/2016  Elsevier Interactive Patient Education  2019 [employer]vier Inc[employer]

## 2018-09-20 NOTE — Addendum Note (Signed)
Addended byRaynald Blend L on: 09/20/2018 10:20 AM   Modules accepted: Orders

## 2018-10-14 ENCOUNTER — Telehealth: Payer: Self-pay | Admitting: General Practice

## 2018-10-14 NOTE — Telephone Encounter (Signed)
I was informed by front office staff, patient had been scheduled for a LEEP but no one called to inform the patient of her results. Patient is confused and wants to know what this is.  Called patient stating I am returning a call for her as she has questions about her appt. Patient states she "googled" the procedure and knows what it is now. Explained colposcopy results & LEEP to patient. Patient verbalized understanding & had no questions.

## 2018-10-23 ENCOUNTER — Telehealth: Payer: Self-pay | Admitting: Family Medicine

## 2018-10-23 ENCOUNTER — Other Ambulatory Visit: Payer: Self-pay

## 2018-10-23 DIAGNOSIS — I10 Essential (primary) hypertension: Secondary | ICD-10-CM

## 2018-10-23 MED ORDER — LISINOPRIL-HYDROCHLOROTHIAZIDE 20-12.5 MG PO TABS: 1.0000 | ORAL_TABLET | Freq: Every day | ORAL | 1 refills | Status: DC

## 2018-10-23 NOTE — Telephone Encounter (Signed)
Copied from CRM [id number]. Topic: Quick Communication - Rx Refill/Question >> Oct 23, 2018  9:29 AM Burchel, Abbi R wrote: Medication: lisinopril-hydrochlorothiazide Marcell Anger) 20-12.5 MG tablet   Preferred Pharmacy: [facility] [address] [place] - [address] [place] [address] Phone: [phone] Fax: [phone] Not a 24 hour pharmacy; exact hours not known.    Pt was advised that RX refills may take up to 3 business days. We ask that you follow-up with your pharmacy.

## 2018-10-23 NOTE — Telephone Encounter (Signed)
Rx has been sent to pharmacy

## 2018-10-24 ENCOUNTER — Telehealth: Payer: Self-pay | Admitting: *Deleted

## 2018-10-24 NOTE — Telephone Encounter (Signed)
-----   Message from Kathee Delton, RN sent at 10/24/2018 10:40 AM EST ----- Regarding: FW: Patient  ----- Message ----- From: Hermina Staggers, MD Sent: 10/23/2018  10:56 AM EST To: Kathee Delton, RN Subject: FW: Patient                                    Pt can make appt with any provider to discuss options. Thanks Casimiro Needle  ----- Message ----- From: Kathee Delton, RN Sent: 10/21/2018   4:34 PM EST To: Hermina Staggers, MD Subject: FW: Patient                                    Please see below. I'm certain the patient will have questions about this. ----- Message ----- From: Catalina Antigua, MD Sent: 10/21/2018   9:31 AM EST To: Darrel Hoover, RN, Priscille Heidelberg, RN, # Subject: RE: Patient                                    I agree with Wynona Canes. Please forward to ordering provider and recommend repeat pap in 6 or 12 months. If LEEP is still desired, inform patient that she will have to pay out of pocket  Thanks  Peggy ----- Message ----- From: Priscille Heidelberg, RN Sent: 10/18/2018  12:16 PM EST To: Darrel Hoover, RN, Catalina Antigua, MD, # Subject: Patient                                        Hello Tresa Endo, Dr. 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 The LEEP will not be covered by [facility] since the colposcopy was not a CIN-II or greater. Per the history received at [facility] she has no history of an abnormal Pap smear prior to the most recent Pap smear. Can the recommendations be changed to Pap and co-testing at 70-months or 1 year? Please advise.  Thanks, [employer]

## 2018-10-24 NOTE — Telephone Encounter (Signed)
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 I also offered that she could keep the appointment she already has scheduled for LEEP or make a different one to discuss results and her options. She states she would like to cancel LEEP and make 6 month pap. I explained I would have registrars call her with the 6 month pap appointment. I advised  Her to call us if she decides she wants LEEP afterall or has other questions. . She voices understanding.

## 2018-11-04 ENCOUNTER — Ambulatory Visit: Payer: Self-pay | Admitting: Family Medicine

## 2018-11-07 ENCOUNTER — Ambulatory Visit: Payer: No Typology Code available for payment source | Admitting: Obstetrics and Gynecology

## 2019-04-28 ENCOUNTER — Other Ambulatory Visit: Payer: Self-pay | Admitting: Family Medicine

## 2019-04-28 DIAGNOSIS — I10 Essential (primary) hypertension: Secondary | ICD-10-CM

## 2019-04-28 NOTE — Telephone Encounter (Signed)
Requested medication (s) are due for refill today: yes  Requested medication (s) are on the active medication list: yes  Last refill:  10/23/2018  Future visit scheduled: no  Notes to clinic:  Message left for patient to contact office to schedule a follow up appointment   Requested Prescriptions  Pending Prescriptions Disp Refills   lisinopril-hydrochlorothiazide (ZESTORETIC) 20-12.5 MG tablet [Pharmacy Med Name: Lisinopril-hydroCHLOROthiazide 20-12.5 MG Oral Tablet] 90 tablet 0    Sig: TAKE 1 TABLET BY MOUTH ONCE DAILY -  PLEASE  COME  BACK  FOR  A  BP  VISIT  SOON     Cardiovascular:  ACEI + Diuretic Combos Failed - 04/28/2019 11:07 AM      Failed - Na in normal range and within 180 days    Sodium  Date Value Ref Range Status  08/30/2016 140 134 - 144 mmol/L Final         Failed - K in normal range and within 180 days    Potassium  Date Value Ref Range Status  08/30/2016 3.7 3.5 - 5.2 mmol/L Final         Failed - Cr in normal range and within 180 days    Creatinine, Ser  Date Value Ref Range Status  08/30/2016 0.69 0.57 - 1.00 mg/dL Final         Failed - Ca in normal range and within 180 days    Calcium  Date Value Ref Range Status  08/30/2016 9.3 8.7 - 10.2 mg/dL Final         Failed - Valid encounter within last 6 months    Recent Outpatient Visits          1 year ago Cat scratch of right forearm, subsequent encounter   [facility] at [facility], Gelene Mink, PA-C   1 year ago Cellulitis of right upper extremity   [facility], Audrie Lia, PA-C   2 years ago Dysthymia   [facility], Audrie Lia, PA-C   2 years ago Essential hypertension   [facility], Audrie Lia, PA-C   4 years ago Chest pain, unspecified chest pain type   [facility] at [facility], Janalee Dane, MD             Passed - Patient is not pregnant      Passed - Last BP in normal range    BP Readings from Last 1 Encounters:   09/20/18 127/80

## 2019-05-02 ENCOUNTER — Ambulatory Visit (INDEPENDENT_AMBULATORY_CARE_PROVIDER_SITE_OTHER): Payer: PRIVATE HEALTH INSURANCE | Admitting: Family Medicine

## 2019-05-02 ENCOUNTER — Other Ambulatory Visit: Payer: Self-pay

## 2019-05-02 ENCOUNTER — Encounter: Payer: Self-pay | Admitting: Family Medicine

## 2019-05-02 VITALS — BP 142/88 | HR 68 | Temp 99.1°F | Resp 16 | Ht 69.0 in | Wt 176.2 lb

## 2019-05-02 DIAGNOSIS — I10 Essential (primary) hypertension: Secondary | ICD-10-CM

## 2019-05-02 LAB — BASIC METABOLIC PANEL
BUN/Creatinine Ratio: 18 (ref 9–23)
BUN: 13 mg/dL (ref 6–24)
CO2: 25 mmol/L (ref 20–29)
Calcium: 9.3 mg/dL (ref 8.7–10.2)
Chloride: 105 mmol/L (ref 96–106)
Creatinine, Ser: 0.74 mg/dL (ref 0.57–1.00)
GFR calc Af Amer: 105 mL/min/{1.73_m2} (ref >59)
GFR calc non Af Amer: 91 mL/min/{1.73_m2} (ref >59)
Glucose: 95 mg/dL (ref 65–99)
Potassium: 4.4 mmol/L (ref 3.5–5.2)
Sodium: 141 mmol/L (ref 134–144)

## 2019-05-02 MED ORDER — LISINOPRIL-HYDROCHLOROTHIAZIDE 20-12.5 MG PO TABS: 1.0000 | ORAL_TABLET | Freq: Every day | ORAL | 1 refills | Status: DC

## 2019-05-02 NOTE — Progress Notes (Signed)
Subjective:    Patient ID: Kristy Rivas, female    DOB: 10/26/1960, 58 y.o.   MRN: [id number]  HPI Kristy Rivas is a 58 y.o. female Presents today for: Chief Complaint  Patient presents with  . Transitions Of Care    no other concerns  . Medication Refill    zestorectic- out x 3 days  Establishing primary care provider with me.  History of hypertension, attention deficit disorder, Acne, depression per problem list.   Hypertension: BP Readings from Last 3 Encounters:  05/02/19 (!) 142/88  09/20/18 127/80  08/13/18 120/74   Lab Results  Component Value Date   CREATININE 0.69 08/30/2016  Lisinopril HCTZ 20/12.5 mg daily.  No new side effects. Off past 4 days.  Home BP on meds 130/80's.   Mood disorder/depression: Dysthymia noted on visit in 2018.  Had been treated with Pristiq and alprazolam at that time.  Treated by psychiatry, Dr. Evelene CroonKaur.  Treated with Ritalin for ADD, Xanax for sleep in the past Still followed by Dr. Evelene CroonKaur - on Pristiq, Rexulti.  Ritalin and Xanax also managed by Dr. Evelene CroonKaur.   Treated for rosacea, acne in past with Retin A - uses retin A every other day.   Health maintenance: Due for hep C screening, HIV screening, - she reports negative tests years ago.  Colonoscopy - not yet done, plans on establishing insurance soon and would like to delay.  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   Patient Active Problem List   Diagnosis Date Noted  . LGSIL on Pap smear of cervix 09/20/2018  . CYSTIC ACNE 01/12/2009  . Depression (emotion) 11/11/2008  . 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 OTHER SPECIFIED CONDITIONS OF THE TONGUE 11/11/2008  . DRUG ABUSE, HX OF 11/11/2008   Past Medical History:  Diagnosis Date  . ADD (attention deficit disorder) without hyperactivity   . Anxiety   . Anxiety and depression   . Depression   . GERD (gastroesophageal reflux disease)   . Hypertension    Past Surgical History:   Procedure Laterality Date  . FINGER SURGERY     Allergies  Allergen Reactions  . Latex Rash   Prior to Admission medications   Medication Sig Start Date End Date Taking? Authorizing Provider  acetaminophen (TYLENOL) 500 MG tablet Take 500 mg by mouth every 6 (six) hours as needed. For pain   Yes [provider]  ALPRAZolam (XANAX) 0.5 MG tablet Take 1 tablet (0.5 mg total) by mouth at bedtime as needed (For panic symptoms only.). 12/08/16  Yes Ofilia Neaslark, Michael L, PA-C  desvenlafaxine (PRISTIQ) 100 MG 24 hr tablet TAKE 1 TABLET BY MOUTH ONCE DAILY 05/16/17  Yes Ofilia Neaslark, Michael L, PA-C  lisinopril-hydrochlorothiazide (PRINZIDE,ZESTORETIC) 20-12.5 MG tablet Take 1 tablet by mouth daily. Please come back for a bp visit soon. 10/23/18  Yes Doristine BosworthStallings, Zoe A, MD  methylphenidate (RITALIN) 10 MG tablet Take 10 mg by mouth 3 (three) times daily.   Yes [provider]  omeprazole (PRILOSEC) 20 MG capsule Take 40 mg by mouth daily.   Yes [provider]  tretinoin (RETIN-A) 0.05 % cream Apply 1 application topically at bedtime.   Yes [provider]  Brexpiprazole (REXULTI) 1 MG TABS Take 1 mg by mouth daily.    [provider]  naproxen (NAPROSYN) 500 MG tablet Take 1 tablet (500 mg total) by mouth 2 (two) times daily with a meal. Patient not taking: Reported on 05/02/2019 04/03/18  Tereasa Coop, PA-C   Social History   Socioeconomic History  . Marital status: Married    Spouse name: Not on file  . Number of children: Not on file  . Years of education: Not on file  . Highest education level: Not on file  Occupational History  . Not on file  Social Needs  . Financial resource strain: Not on file  . Food insecurity    Worry: Sometimes true    Inability: Sometimes true  . Transportation needs    Medical: Yes    Non-medical: Yes  Tobacco Use  . Smoking status: Current Every Day Smoker    Packs/day: 0.25    Types: Cigarettes  . Smokeless tobacco:  Never Used  Substance and Sexual Activity  . Alcohol use: Yes    Alcohol/week: 0.0 standard drinks    Comment: on occasion  . 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 Sexual activity: Yes    Birth control/protection: None  Lifestyle  . Physical activity    Days per week: Not on file    Minutes per session: Not on file  . Stress: Not on file  Relationships  . Social Herbalist on phone: Not on file    Gets together: Not on file    Attends religious service: Not on file    Active member of club or organization: Not on file    Attends meetings of clubs or organizations: Not on file    Relationship status: Not on file  . Intimate partner violence    Fear of current or ex partner: Not on file    Emotionally abused: Not on file    Physically abused: Not on file    Forced sexual activity: Not on file  Other Topics Concern  . Not on file  Social History Narrative  . Not on file    Review of Systems  Constitutional: Negative for fatigue and unexpected weight change.  Respiratory: Negative for chest tightness and shortness of breath.   Cardiovascular: Negative for chest pain, palpitations and leg swelling.  Gastrointestinal: Negative for abdominal pain and blood in stool.  Neurological: Negative for dizziness, syncope, light-headedness and headaches.       Objective:   Physical Exam Vitals signs reviewed.  Constitutional:      Appearance: She is well-developed.  HENT:     Head: Normocephalic and atraumatic.  Eyes:     Conjunctiva/sclera: Conjunctivae normal.     Pupils: Pupils are equal, round, and reactive to light.  Neck:     Vascular: No carotid bruit.  Cardiovascular:     Rate and Rhythm: Normal rate and regular rhythm.     Heart sounds: Normal heart sounds.  Pulmonary:     Effort: Pulmonary effort is normal.     Breath sounds: Normal breath sounds.  Abdominal:     Palpations: Abdomen is soft. There is  no pulsatile mass.     Tenderness: There is no abdominal tenderness.  Skin:    General: Skin is warm and dry.  Neurological:     Mental Status: She is alert and oriented to person, place, and time.  Psychiatric:        Behavior: Behavior normal.    Vitals:   05/02/19 0838  BP: (!) 142/88  Pulse: 68  Resp: 16  Temp: 99.1 F (37.3 C)  TempSrc: Oral  SpO2: 98%  Weight: 176  lb 3.2 oz (79.9 kg)  Height: 5\' 9"  (1.753 m)       Assessment & Plan:   Kristy Rivas is a 58 y.o. female Essential hypertension, benign - Plan: Basic metabolic panel, lisinopril-hydrochlorothiazide (ZESTORETIC) 20-12.5 MG tablet  -Controlled by home readings, slight elevation today off meds past 4 days.  Restart Zestoretic same dose.  Check BMP.    Other health maintenance discussed, but plans to follow-up to discuss further once health insurance coverage starts.    Meds ordered this encounter  Medications  . lisinopril-hydrochlorothiazide (ZESTORETIC) 20-12.5 MG tablet    Sig: Take 1 tablet by mouth daily.    Dispense:  90 tablet    Refill:  1   Patient Instructions   Nice meeting you today.  No change in meds at this time. I will check some bloodwork, but please follow up in next 6 months for a physical and to review other health maintenance. Let me know if there are questions sooner.   If you have lab work done today you will be contacted with your lab results within the next 2 weeks.  If you have not heard from [place] then please contact us. The fastest way to get your results is to register for My Chart.   IF you received an x-ray today, you will receive an invoice from [facility]. Please contact [facility] at [phone] with questions or concerns regarding your invoice.   IF you received labwork today, you will receive an invoice from [place]. Please contact [facility] at [phone] with questions or concerns regarding your invoice.   Our billing staff will not  be able to assist you with questions regarding bills from these companies.  You will be contacted with the lab results as soon as they are available. The fastest way to get your results is to activate your My Chart account. Instructions are located on the last page of this paperwork. If you have not heard from [place] regarding the results in 2 weeks, please contact this office.      Signed,   Meredith Staggers, MD Primary Care at [facility].  05/02/19 9:20 AM

## 2019-05-02 NOTE — Patient Instructions (Addendum)
Nice meeting you today.  No change in meds at this time. I will check some bloodwork, but please follow up in next 6 months for a physical and to review other health maintenance. Let me know if there are questions sooner.   If you have lab work done today you will be contacted with your lab results within the next 2 weeks.  If you have not heard from [place] then please contact us. The fastest way to get your results is to register for My Chart.   IF you received an x-ray today, you will receive an invoice from [facility]. Please contact [facility] at [phone] with questions or concerns regarding your invoice.   IF you received labwork today, you will receive an invoice from [place]. Please contact [facility] at [phone] with questions or concerns regarding your invoice.   Our billing staff will not be able to assist you with questions regarding bills from these companies.  You will be contacted with the lab results as soon as they are available. The fastest way to get your results is to activate your My Chart account. Instructions are located on the last page of this paperwork. If you have not heard from [place] regarding the results in 2 weeks, please contact this office.

## 2019-05-14 ENCOUNTER — Encounter: Payer: Self-pay | Admitting: Radiology

## 2019-06-06 ENCOUNTER — Ambulatory Visit: Payer: No Typology Code available for payment source | Admitting: Obstetrics and Gynecology

## 2019-06-27 ENCOUNTER — Encounter: Payer: Self-pay | Admitting: Obstetrics and Gynecology

## 2019-06-27 ENCOUNTER — Other Ambulatory Visit: Payer: Self-pay

## 2019-06-27 ENCOUNTER — Encounter: Payer: Self-pay | Admitting: Registered Nurse

## 2019-06-27 ENCOUNTER — Ambulatory Visit (INDEPENDENT_AMBULATORY_CARE_PROVIDER_SITE_OTHER): Payer: PRIVATE HEALTH INSURANCE | Admitting: Registered Nurse

## 2019-06-27 ENCOUNTER — Ambulatory Visit: Payer: No Typology Code available for payment source | Admitting: Obstetrics and Gynecology

## 2019-06-27 VITALS — BP 119/79 | HR 98 | Temp 98.2°F | Resp 18 | Wt 172.0 lb

## 2019-06-27 DIAGNOSIS — R300 Dysuria: Secondary | ICD-10-CM

## 2019-06-27 DIAGNOSIS — R310 Gross hematuria: Secondary | ICD-10-CM | POA: Diagnosis not present

## 2019-06-27 LAB — POCT URINALYSIS DIP (MANUAL ENTRY)
Bilirubin, UA: NEGATIVE
Blood, UA: NEGATIVE
Glucose, UA: NEGATIVE mg/dL
Ketones, POC UA: NEGATIVE mg/dL
Leukocytes, UA: NEGATIVE
Nitrite, UA: NEGATIVE
Protein Ur, POC: NEGATIVE mg/dL
Spec Grav, UA: 1.025 (ref 1.010–1.025)
Urobilinogen, UA: 0.2 E.U./dL
pH, UA: 6 (ref 5.0–8.0)

## 2019-06-27 MED ORDER — PHENAZOPYRIDINE HCL 95 MG PO TABS: 95.0000 mg | ORAL_TABLET | Freq: Three times a day (TID) | ORAL | 0 refills | Status: DC | PRN

## 2019-06-27 MED ORDER — NITROFURANTOIN MONOHYD MACRO 100 MG PO CAPS: 100.0000 mg | ORAL_CAPSULE | Freq: Two times a day (BID) | ORAL | 0 refills | Status: AC

## 2019-06-27 NOTE — Patient Instructions (Signed)
° ° ° °  If you have lab work done today you will be contacted with your lab results within the next 2 weeks.  If you have not heard from us then please contact us. The fastest way to get your results is to register for My Chart. ° ° °IF you received an x-ray today, you will receive an invoice from [facility]. Please contact [facility] at [phone] with questions or concerns regarding your invoice.  ° °IF you received labwork today, you will receive an invoice from [facility]. Please contact [facility] at [phone] with questions or concerns regarding your invoice.  ° °Our billing staff will not be able to assist you with questions regarding bills from these companies. ° °You will be contacted with the lab results as soon as they are available. The fastest way to get your results is to activate your My Chart account. Instructions are located on the last page of this paperwork. If you have not heard from us regarding the results in 2 weeks, please contact this office. °  ° ° ° °

## 2019-06-28 LAB — URINE CULTURE

## 2019-06-30 NOTE — Progress Notes (Signed)
Patient did not keep her pap smear appointment for 06/27/2019. Office to try and contact patient for rescheduling.   Durene Romans MD Attending Center for [employer] farm manager)

## 2019-06-30 NOTE — Progress Notes (Signed)
Acute Office Visit  Subjective:    Patient ID: Kristy Rivas, female    DOB: Jan 30, 1961, 58 y.o.   MRN: [id number]  Chief Complaint  Patient presents with  . Dysuria    x 1 week   . Urinary Frequency    HPI Patient is in today for one week of pain and frequency with urination. Notes that it has been somewhat better, but she had taken an antibiotic she had leftover at home. She cannot remember what antibiotic this was an reports she only took one, and symptoms have not completely resolved Denies suprapubic pain and flank pain. Denies fever, chills, myalgias, and other systemic signs of infection.   Past Medical History:  Diagnosis Date  . ADD (attention deficit disorder) without hyperactivity   . Anxiety   . Anxiety and depression   . Depression   . GERD (gastroesophageal reflux disease)   . Hypertension     Past Surgical History:  Procedure Laterality Date  . FINGER SURGERY      Family History  Problem Relation Age of Onset  . Rheum arthritis Mother   . Cancer Father   . Breast cancer Neg Hx     Social History   Socioeconomic History  . Marital status: Married    Spouse name: Not on file  . Number of children: Not on file  . Years of education: Not on file  . Highest education level: Not on file  Occupational History  . Not on file  Social Needs  . Financial resource strain: Not on file  . Food insecurity    Worry: Sometimes true    Inability: Sometimes true  . Transportation needs    Medical: Yes    Non-medical: Yes  Tobacco Use  . Smoking status: Current Every Day Smoker    Packs/day: 0.25    Types: Cigarettes  . Smokeless tobacco: Never Used  Substance and Sexual Activity  . Alcohol use: Yes    Alcohol/week: 0.0 standard drinks    Comment: on occasion  . 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 Sexual activity: Yes    Birth control/protection: None  Lifestyle  . Physical activity     Days per week: Not on file    Minutes per session: Not on file  . Stress: Not on file  Relationships  . Social Musician on phone: Not on file    Gets together: Not on file    Attends religious service: Not on file    Active member of club or organization: Not on file    Attends meetings of clubs or organizations: Not on file    Relationship status: Not on file  . Intimate partner violence    Fear of current or ex partner: Not on file    Emotionally abused: Not on file    Physically abused: Not on file    Forced sexual activity: Not on file  Other Topics Concern  . Not on file  Social History Narrative  . Not on file    Outpatient Medications Prior to Visit  Medication Sig Dispense Refill  . acetaminophen (TYLENOL) 500 MG tablet Take 500 mg by mouth every 6 (six) hours as needed. For pain    . ALPRAZolam (XANAX) 0.5 MG tablet Take 1 tablet (0.5 mg total) by mouth at bedtime as needed (For panic symptoms only.). 7 tablet 0  . Brexpiprazole (  REXULTI) 1 MG TABS Take 1 mg by mouth daily.    Marland Kitchen desvenlafaxine (PRISTIQ) 100 MG 24 hr tablet TAKE 1 TABLET BY MOUTH ONCE DAILY 30 tablet 2  . lisinopril-hydrochlorothiazide (ZESTORETIC) 20-12.5 MG tablet Take 1 tablet by mouth daily. 90 tablet 1  . methylphenidate (RITALIN) 10 MG tablet Take 10 mg by mouth 3 (three) times daily.    Marland Kitchen omeprazole (PRILOSEC) 20 MG capsule Take 40 mg by mouth daily.    Marland Kitchen tretinoin (RETIN-A) 0.05 % cream Apply 1 application topically at bedtime.     No facility-administered medications prior to visit.     Allergies  Allergen Reactions  . Latex Rash    ROS Per hpi     Objective:    Physical Exam  Constitutional: She is oriented to person, place, and time. She appears well-developed and well-nourished. No distress.  Cardiovascular: Normal rate and regular rhythm.  Pulmonary/Chest: Effort normal. No respiratory distress.  Neurological: She is alert and oriented to person, place, and time.   Skin: Skin is warm and dry. No rash noted. She is not diaphoretic. No erythema. No pallor.  Psychiatric: She has a normal mood and affect. Her behavior is normal. Judgment and thought content normal.  Nursing note and vitals reviewed. No CVA tenderness  UA dip on site negative  BP 119/79   Pulse 98   Temp 98.2 F (36.8 C) (Oral)   Resp 18   Wt 172 lb (78 kg)   LMP 10/07/2017   SpO2 96%   BMI 25.40 kg/m  Wt Readings from Last 3 Encounters:  06/27/19 172 lb (78 kg)  05/02/19 176 lb 3.2 oz (79.9 kg)  09/20/18 170 lb (77.1 kg)    Health Maintenance Due  Topic Date Due  . Hepatitis C Screening  10-Dec-1960  . HIV Screening  06/04/1977  . COLONOSCOPY  06/04/2012  . INFLUENZA VACCINE  04/05/2019    There are no preventive care reminders to display for this patient.   Lab Results  Component Value Date   TSH 2.028 05/19/2010   Lab Results  Component Value Date   WBC 9.8 08/30/2016   HGB 13.8 08/30/2016   HCT 39.7 08/30/2016   MCV 93.8 08/30/2016   PLT 208 02/08/2012   Lab Results  Component Value Date   NA 141 05/02/2019   K 4.4 05/02/2019   CO2 25 05/02/2019   GLUCOSE 95 05/02/2019   BUN 13 05/02/2019   CREATININE 0.74 05/02/2019   BILITOT 0.3 08/30/2016   ALKPHOS 79 08/30/2016   AST 20 08/30/2016   ALT 22 08/30/2016   PROT 7.2 08/30/2016   ALBUMIN 4.3 08/30/2016   CALCIUM 9.3 05/02/2019   No results found for: CHOL No results found for: HDL No results found for: LDLCALC No results found for: TRIG No results found for: CHOLHDL No results found for: HGBA1C     Assessment & Plan:   Problem List Items Addressed This Visit    None    Visit Diagnoses    Dysuria    -  Primary   Relevant Medications   phenazopyridine (PYRIDIUM) 95 MG tablet   Other Relevant Orders   POCT urinalysis dipstick (Completed)   Urine Culture (Completed)   Gross hematuria       Relevant Medications   nitrofurantoin, macrocrystal-monohydrate, (MACROBID) 100 MG capsule        Meds ordered this encounter  Medications  . nitrofurantoin, macrocrystal-monohydrate, (MACROBID) 100 MG capsule    Sig: Take 1 capsule (  100 mg total) by mouth 2 (two) times daily for 5 days.    Dispense:  10 capsule    Refill:  0    Order Specific Question:   Supervising Provider    Answer:   Collie SiadSTALLINGS, ZOE A [id number]]  . phenazopyridine (PYRIDIUM) 95 MG tablet    Sig: Take 1 tablet (95 mg total) by mouth 3 (three) times daily as needed for pain.    Dispense:  10 tablet    Refill:  0    Order Specific Question:   Supervising Provider    Answer:   Doristine BosworthSTALLINGS, ZOE A [id number]]    PLAN  Will treat as UTI given symptoms and history of UTI. Given she has taken one antibiotic, I feel it would be prudent to ensure that the infection is managed entirely to avoid recurrence.   Return if symptoms worsen or fail to improve  Patient encouraged to call clinic with any questions, comments, or concerns.   Janeece Ageeichard Yecenia Dalgleish, NP

## 2019-07-01 ENCOUNTER — Telehealth: Payer: Self-pay | Admitting: *Deleted

## 2019-07-01 NOTE — Telephone Encounter (Signed)
I called Kristy Rivas and informed her of the appointment for her papsmear and our new location. She voices understanding. Mileah Hemmer,RN

## 2019-07-01 NOTE — Telephone Encounter (Signed)
-----   Message from Aletha Halim, MD sent at 06/30/2019  9:49 AM EDT ----- Regarding: try and contact to reschedule for pap smear only visit. if not successful please send letter. Thanks!

## 2019-07-04 ENCOUNTER — Encounter

## 2019-07-21 ENCOUNTER — Telehealth: Payer: Self-pay | Admitting: Family Medicine

## 2019-07-21 NOTE — Telephone Encounter (Signed)
LVM to r/s appt on 11/04/2019 w/ dr. Carlota Raspberry. Provider will be out of the office on that day

## 2019-07-28 ENCOUNTER — Ambulatory Visit: Payer: No Typology Code available for payment source | Admitting: Obstetrics and Gynecology

## 2019-08-27 ENCOUNTER — Ambulatory Visit: Payer: Self-pay | Admitting: Obstetrics and Gynecology

## 2019-10-06 ENCOUNTER — Telehealth (HOSPITAL_COMMUNITY): Payer: Self-pay

## 2019-10-06 NOTE — Telephone Encounter (Signed)
I contacted patient and informed her that her [facility] coverage had expired, due for a pap,and could renew [facility] if needed, following up to see if she is needs a [facility] appointment or will schedule with her PCP. Patient states she plans to call and schedule with her PCP.

## 2019-11-04 ENCOUNTER — Encounter: Payer: PRIVATE HEALTH INSURANCE | Admitting: Family Medicine

## 2019-11-05 ENCOUNTER — Other Ambulatory Visit: Payer: Self-pay

## 2019-11-05 ENCOUNTER — Telehealth: Payer: Self-pay | Admitting: Family Medicine

## 2019-11-05 DIAGNOSIS — I10 Essential (primary) hypertension: Secondary | ICD-10-CM

## 2019-11-05 MED ORDER — LISINOPRIL-HYDROCHLOROTHIAZIDE 20-12.5 MG PO TABS: 1.0000 | ORAL_TABLET | Freq: Every day | ORAL | 1 refills | Status: DC

## 2019-11-05 NOTE — Telephone Encounter (Signed)
Pt would like a cutesy refill on her  lisinopril-hydrochlorothiazide (ZESTORETIC) 20-12.5 MG tablet [[id number]]. She has an upcoming appointment on 11/12/19. She is completely out.  Pharmacy  [facility] [address], [place] - [address]  [address] [place] [address]  Phone:  [phone] Fax:  [phone]    Please advise at [phone]

## 2019-11-12 ENCOUNTER — Ambulatory Visit (INDEPENDENT_AMBULATORY_CARE_PROVIDER_SITE_OTHER): Payer: PRIVATE HEALTH INSURANCE | Admitting: Registered Nurse

## 2019-11-12 ENCOUNTER — Other Ambulatory Visit: Payer: Self-pay

## 2019-11-12 ENCOUNTER — Encounter: Payer: Self-pay | Admitting: Registered Nurse

## 2019-11-12 VITALS — BP 134/84 | HR 72 | Temp 97.6°F | Ht 69.0 in | Wt 176.2 lb

## 2019-11-12 DIAGNOSIS — Z13220 Encounter for screening for lipoid disorders: Secondary | ICD-10-CM | POA: Diagnosis not present

## 2019-11-12 DIAGNOSIS — I10 Essential (primary) hypertension: Secondary | ICD-10-CM

## 2019-11-12 DIAGNOSIS — Z1329 Encounter for screening for other suspected endocrine disorder: Secondary | ICD-10-CM

## 2019-11-12 DIAGNOSIS — Z13228 Encounter for screening for other metabolic disorders: Secondary | ICD-10-CM | POA: Diagnosis not present

## 2019-11-12 DIAGNOSIS — Z130 Encounter for screening for diseases of the blood and blood-forming organs and certain disorders involving the immune mechanism: Secondary | ICD-10-CM

## 2019-11-12 MED ORDER — LISINOPRIL-HYDROCHLOROTHIAZIDE 20-25 MG PO TABS: 1.0000 | ORAL_TABLET | Freq: Every day | ORAL | 3 refills | Status: DC

## 2019-11-12 NOTE — Progress Notes (Signed)
Established Patient Office Visit  Subjective:  Patient ID: Kristy Rivas, female    DOB: 1961-07-29  Age: 59 y.o. MRN: [id number]  CC:  Chief Complaint  Patient presents with  . Follow-up    for medication check , and has some question about blood pressure medication. PHQ9=12    HPI Kristy Rivas presents for BP med refill  Reports that BP has been running high at home - has run out of medication, has been borrowing amlodipine 5mg  from someone.  Denies CV symptoms. States highest BP at home has been 150/100  No further concerns. Feels well overall. BP wnl but borderline today.  Past Medical History:  Diagnosis Date  . ADD (attention deficit disorder) without hyperactivity   . Anxiety   . Anxiety and depression   . Depression   . GERD (gastroesophageal reflux disease)   . Hypertension     Past Surgical History:  Procedure Laterality Date  . FINGER SURGERY      Family History  Problem Relation Age of Onset  . Rheum arthritis Mother   . Esophageal cancer Father   . Breast cancer Neg Hx     Social History   Socioeconomic History  . Marital status: Married    Spouse name: Not on file  . Number of children: 2  . Years of education: Not on file  . Highest education level: Not on file  Occupational History  . Occupation: data entry  Tobacco Use  . Smoking status: Current Every Day Smoker    Packs/day: 0.25    Types: Cigarettes  . Smokeless tobacco: Never Used  Substance and Sexual Activity  . Alcohol use: Yes    Alcohol/week: 0.0 standard drinks    Comment: on occasion  . 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 Sexual activity: Yes    Birth control/protection: None  Other Topics Concern  . Not on file  Social History Narrative  . Not on file   Social Determinants of Health   Financial Resource Strain:   . Difficulty of Paying Living Expenses: Not on file  Food Insecurity:     . Worried About Charity fundraiser in the Last Year: Not on file  . Ran Out of Food in the Last Year: Not on file  Transportation Needs:   . Lack of Transportation (Medical): Not on file  . Lack of Transportation (Non-Medical): Not on file  Physical Activity:   . Days of Exercise per Week: Not on file  . Minutes of Exercise per Session: Not on file  Stress:   . Feeling of Stress : Not on file  Social Connections:   . Frequency of Communication with Friends and Family: Not on file  . Frequency of Social Gatherings with Friends and Family: Not on file  . Attends Religious Services: Not on file  . Active Member of Clubs or Organizations: Not on file  . Attends Archivist Meetings: Not on file  . Marital Status: Not on file  Intimate Partner Violence:   . Fear of Current or Ex-Partner: Not on file  . Emotionally Abused: Not on file  . Physically Abused: Not on file  . Sexually Abused: Not on file    Outpatient Medications Prior to Visit  Medication Sig Dispense Refill  . acetaminophen (TYLENOL) 500 MG tablet Take 500 mg by mouth every 6 (six) hours as needed. For pain    .  ALPRAZolam (XANAX) 0.5 MG tablet Take 1 tablet (0.5 mg total) by mouth at bedtime as needed (For panic symptoms only.). 7 tablet 0  . Brexpiprazole (REXULTI) 1 MG TABS Take 1 mg by mouth daily.    Marland Kitchen desvenlafaxine (PRISTIQ) 100 MG 24 hr tablet TAKE 1 TABLET BY MOUTH ONCE DAILY 30 tablet 2  . methylphenidate (RITALIN) 10 MG tablet Take 10 mg by mouth 3 (three) times daily.    Marland Kitchen omeprazole (PRILOSEC) 20 MG capsule Take 40 mg by mouth daily.    . phenazopyridine (PYRIDIUM) 95 MG tablet Take 1 tablet (95 mg total) by mouth 3 (three) times daily as needed for pain. 10 tablet 0  . tretinoin (RETIN-A) 0.05 % cream Apply 1 application topically at bedtime.    Marland Kitchen lisinopril-hydrochlorothiazide (ZESTORETIC) 20-12.5 MG tablet Take 1 tablet by mouth daily. 90 tablet 1   No facility-administered medications prior to  visit.    Allergies  Allergen Reactions  . Latex Rash    ROS Review of Systems  Constitutional: Negative.   HENT: Negative.   Eyes: Negative.   Respiratory: Negative.   Cardiovascular: Negative.   Gastrointestinal: Negative.   Endocrine: Negative.   Genitourinary: Negative.   Musculoskeletal: Negative.   Skin: Negative.   Allergic/Immunologic: Negative.   Neurological: Negative.   Hematological: Negative.   Psychiatric/Behavioral: Negative.   All other systems reviewed and are negative.     Objective:    Physical Exam  Constitutional: She is oriented to person, place, and time. She appears well-developed and well-nourished. No distress.  Cardiovascular: Normal rate, regular rhythm and normal heart sounds. Exam reveals no gallop and no friction rub.  No murmur heard. Pulmonary/Chest: Effort normal and breath sounds normal. No respiratory distress. She has no wheezes. She has no rales. She exhibits no tenderness.  Neurological: She is alert and oriented to person, place, and time.  Skin: Skin is warm and dry. No rash noted. She is not diaphoretic. No erythema. No pallor.  Psychiatric: She has a normal mood and affect. Her behavior is normal. Judgment and thought content normal.  Nursing note and vitals reviewed.   BP 134/84   Pulse 72   Temp 97.6 F (36.4 C) (Temporal)   Ht 5\' 9"  (1.753 m)   Wt 176 lb 3.2 oz (79.9 kg)   LMP 10/07/2017   SpO2 97%   BMI 26.02 kg/m  Wt Readings from Last 3 Encounters:  11/12/19 176 lb 3.2 oz (79.9 kg)  06/27/19 172 lb (78 kg)  05/02/19 176 lb 3.2 oz (79.9 kg)     Health Maintenance Due  Topic Date Due  . Hepatitis C Screening  Oct 26, 1960  . HIV Screening  06/04/1977  . COLONOSCOPY  06/04/2012  . INFLUENZA VACCINE  04/05/2019    There are no preventive care reminders to display for this patient.  Lab Results  Component Value Date   TSH 2.028 05/19/2010   Lab Results  Component Value Date   WBC 9.8 08/30/2016   HGB  13.8 08/30/2016   HCT 39.7 08/30/2016   MCV 93.8 08/30/2016   PLT 208 02/08/2012   Lab Results  Component Value Date   NA 141 05/02/2019   K 4.4 05/02/2019   CO2 25 05/02/2019   GLUCOSE 95 05/02/2019   BUN 13 05/02/2019   CREATININE 0.74 05/02/2019   BILITOT 0.3 08/30/2016   ALKPHOS 79 08/30/2016   AST 20 08/30/2016   ALT 22 08/30/2016   PROT 7.2 08/30/2016   ALBUMIN 4.3  08/30/2016   CALCIUM 9.3 05/02/2019   No results found for: CHOL No results found for: HDL No results found for: LDLCALC No results found for: TRIG No results found for: CHOLHDL No results found for: HCWC3J    Assessment & Plan:   Problem List Items Addressed This Visit    None    Visit Diagnoses    Essential hypertension, benign    -  Primary   Relevant Medications   lisinopril-hydrochlorothiazide (ZESTORETIC) 20-25 MG tablet   Screening for endocrine, metabolic and immunity disorder       Relevant Orders   CBC   Comprehensive metabolic panel   Hemoglobin A1c   TSH   Lipid screening       Relevant Orders   Lipid Panel      Meds ordered this encounter  Medications  . lisinopril-hydrochlorothiazide (ZESTORETIC) 20-25 MG tablet    Sig: Take 1 tablet by mouth daily.    Dispense:  90 tablet    Refill:  3    Order Specific Question:   Supervising Provider    Answer:   Doristine Bosworth [id number]    Follow-up: No follow-ups on file.   PLAN  Increase lisinopril hctz to 20-25mg  PO qd  Continue home bp checks, return if elevated  Counseled on not taking medications not prescribed to her  Labs drawn, will follow up as warranted  Patient encouraged to call clinic with any questions, comments, or concerns.  Janeece Agee, NP

## 2019-11-13 ENCOUNTER — Other Ambulatory Visit: Payer: Self-pay | Admitting: Registered Nurse

## 2019-11-13 DIAGNOSIS — E782 Mixed hyperlipidemia: Secondary | ICD-10-CM

## 2019-11-13 LAB — COMPREHENSIVE METABOLIC PANEL
ALT: 25 IU/L (ref 0–32)
AST: 20 IU/L (ref 0–40)
Albumin/Globulin Ratio: 2.1 (ref 1.2–2.2)
Albumin: 4.5 g/dL (ref 3.8–4.9)
Alkaline Phosphatase: 117 IU/L (ref 39–117)
BUN/Creatinine Ratio: 21 (ref 9–23)
BUN: 15 mg/dL (ref 6–24)
Bilirubin Total: 0.4 mg/dL (ref 0.0–1.2)
CO2: 25 mmol/L (ref 20–29)
Calcium: 9.6 mg/dL (ref 8.7–10.2)
Chloride: 101 mmol/L (ref 96–106)
Creatinine, Ser: 0.71 mg/dL (ref 0.57–1.00)
GFR calc Af Amer: 109 mL/min/{1.73_m2} (ref >59)
GFR calc non Af Amer: 95 mL/min/{1.73_m2} (ref >59)
Globulin, Total: 2.1 g/dL (ref 1.5–4.5)
Glucose: 81 mg/dL (ref 65–99)
Potassium: 4.5 mmol/L (ref 3.5–5.2)
Sodium: 139 mmol/L (ref 134–144)
Total Protein: 6.6 g/dL (ref 6.0–8.5)

## 2019-11-13 LAB — LIPID PANEL
Chol/HDL Ratio: 3.8 ratio (ref 0.0–4.4)
Cholesterol, Total: 232 mg/dL — ABNORMAL HIGH (ref 100–199)
HDL: 61 mg/dL (ref >39)
LDL Chol Calc (NIH): 150 mg/dL — ABNORMAL HIGH (ref 0–99)
Triglycerides: 119 mg/dL (ref 0–149)
VLDL Cholesterol Cal: 21 mg/dL (ref 5–40)

## 2019-11-13 LAB — HEMOGLOBIN A1C
Est. average glucose Bld gHb Est-mCnc: 100 mg/dL
Hgb A1c MFr Bld: 5.1 % (ref 4.8–5.6)

## 2019-11-13 LAB — CBC
Hematocrit: 39.6 % (ref 34.0–46.6)
Hemoglobin: 13.5 g/dL (ref 11.1–15.9)
MCH: 32.5 pg (ref 26.6–33.0)
MCHC: 34.1 g/dL (ref 31.5–35.7)
MCV: 95 fL (ref 79–97)
Platelets: 241 10*3/uL (ref 150–450)
RBC: 4.15 x10E6/uL (ref 3.77–5.28)
RDW: 11.7 % (ref 11.7–15.4)
WBC: 5.4 10*3/uL (ref 3.4–10.8)

## 2019-11-13 LAB — TSH: TSH: 0.97 u[IU]/mL (ref 0.450–4.500)

## 2019-11-13 MED ORDER — ATORVASTATIN CALCIUM 40 MG PO TABS: 40.0000 mg | ORAL_TABLET | Freq: Every day | ORAL | 1 refills | Status: DC

## 2019-11-13 NOTE — Progress Notes (Signed)
Labs show elevated lipids Start atorvastatin 40mg  PO qd Return in 6 mos for lipid check  , NP

## 2020-05-12 ENCOUNTER — Ambulatory Visit (INDEPENDENT_AMBULATORY_CARE_PROVIDER_SITE_OTHER): Payer: 59 | Admitting: Registered Nurse

## 2020-05-12 ENCOUNTER — Encounter: Payer: Self-pay | Admitting: Registered Nurse

## 2020-05-12 ENCOUNTER — Other Ambulatory Visit: Payer: Self-pay

## 2020-05-12 VITALS — BP 129/82 | HR 62 | Temp 98.0°F | Resp 18 | Ht 69.0 in | Wt 177.6 lb

## 2020-05-12 DIAGNOSIS — Z1159 Encounter for screening for other viral diseases: Secondary | ICD-10-CM

## 2020-05-12 DIAGNOSIS — I10 Essential (primary) hypertension: Secondary | ICD-10-CM | POA: Diagnosis not present

## 2020-05-12 DIAGNOSIS — E782 Mixed hyperlipidemia: Secondary | ICD-10-CM | POA: Diagnosis not present

## 2020-05-12 NOTE — Patient Instructions (Signed)
° ° ° °  If you have lab work done today you will be contacted with your lab results within the next 2 weeks.  If you have not heard from us then please contact us. The fastest way to get your results is to register for My Chart. ° ° °IF you received an x-ray today, you will receive an invoice from [facility]. Please contact [facility] at [phone] with questions or concerns regarding your invoice.  ° °IF you received labwork today, you will receive an invoice from [facility]. Please contact [facility] at [phone] with questions or concerns regarding your invoice.  ° °Our billing staff will not be able to assist you with questions regarding bills from these companies. ° °You will be contacted with the lab results as soon as they are available. The fastest way to get your results is to activate your My Chart account. Instructions are located on the last page of this paperwork. If you have not heard from us regarding the results in 2 weeks, please contact this office. °  ° ° ° °

## 2020-05-13 LAB — HIV ANTIBODY (ROUTINE TESTING W REFLEX): HIV Screen 4th Generation wRfx: NONREACTIVE

## 2020-05-13 LAB — CBC
Hematocrit: 38.9 % (ref 34.0–46.6)
Hemoglobin: 13 g/dL (ref 11.1–15.9)
MCH: 31.6 pg (ref 26.6–33.0)
MCHC: 33.4 g/dL (ref 31.5–35.7)
MCV: 95 fL (ref 79–97)
Platelets: 224 10*3/uL (ref 150–450)
RBC: 4.11 x10E6/uL (ref 3.77–5.28)
RDW: 11.6 % — ABNORMAL LOW (ref 11.7–15.4)
WBC: 4.7 10*3/uL (ref 3.4–10.8)

## 2020-05-13 LAB — LIPID PANEL
Chol/HDL Ratio: 4.2 ratio (ref 0.0–4.4)
Cholesterol, Total: 220 mg/dL — ABNORMAL HIGH (ref 100–199)
HDL: 53 mg/dL (ref >39)
LDL Chol Calc (NIH): 145 mg/dL — ABNORMAL HIGH (ref 0–99)
Triglycerides: 125 mg/dL (ref 0–149)
VLDL Cholesterol Cal: 22 mg/dL (ref 5–40)

## 2020-05-13 LAB — BASIC METABOLIC PANEL
BUN/Creatinine Ratio: 21 (ref 9–23)
BUN: 15 mg/dL (ref 6–24)
CO2: 24 mmol/L (ref 20–29)
Calcium: 9.7 mg/dL (ref 8.7–10.2)
Chloride: 100 mmol/L (ref 96–106)
Creatinine, Ser: 0.7 mg/dL (ref 0.57–1.00)
GFR calc Af Amer: 111 mL/min/{1.73_m2} (ref >59)
GFR calc non Af Amer: 96 mL/min/{1.73_m2} (ref >59)
Glucose: 105 mg/dL — ABNORMAL HIGH (ref 65–99)
Potassium: 4.3 mmol/L (ref 3.5–5.2)
Sodium: 137 mmol/L (ref 134–144)

## 2020-05-13 LAB — HEPATITIS C ANTIBODY: Hep C Virus Ab: <0.1 s/co ratio (ref 0.0–0.9)

## 2020-05-14 ENCOUNTER — Encounter: Payer: Self-pay | Admitting: Registered Nurse

## 2020-07-31 ENCOUNTER — Encounter: Payer: Self-pay | Admitting: Registered Nurse

## 2020-07-31 NOTE — Progress Notes (Signed)
Established Patient Office Visit  Subjective:  Patient ID: Kristy Rivas, female    DOB: 05/04/1961  Age: 59 y.o. MRN: [id number]  CC:  Chief Complaint  Patient presents with   Follow-up    6 month follow up for hypertension and Recheck on new medication.   Medication Problem    patient states the atorvastatin makes her sick    HPI Kristy Rivas presents for 6 mo follow up   Hypertension: Patient Currently taking: lisinopril-hctz 20-25mg  PO qd Good effect. No AEs. Denies CV symptoms including: chest pain, shob, doe, headache, visual changes, fatigue, claudication, and dependent edema.   Previous readings and labs: BP Readings from Last 3 Encounters:  05/12/20 129/82  11/12/19 134/84  06/27/19 119/79   Lab Results  Component Value Date   CREATININE 0.70 05/12/2020   Does note that after starting atorvastatin had GI upset. Stopped taking. Lifestyle changes instead. No other complaints.    Past Medical History:  Diagnosis Date   ADD (attention deficit disorder) without hyperactivity    Anxiety    Anxiety and depression    Depression    GERD (gastroesophageal reflux disease)    Hypertension     Past Surgical History:  Procedure Laterality Date   FINGER SURGERY      Family History  Problem Relation Age of Onset   Rheum arthritis Mother    Esophageal cancer Father    Breast cancer Neg Hx     Social History   Socioeconomic History   Marital status: Married    Spouse name: Not on file   Number of children: 2   Years of education: Not on file   Highest education level: Not on file  Occupational History   Occupation: data entry  Tobacco Use   Smoking status: Current Every Day Smoker    Packs/day: 0.25    Types: Cigarettes   Smokeless tobacco: Never Used  Vaping Use   Vaping Use: Never used  Substance and Sexual Activity   Alcohol use: Yes    Alcohol/week: 0.0 standard drinks    Comment: on occasion    Drug use: Yes    Types: Marijuana    Comment: 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, in remission   Sexual activity: Yes    Birth control/protection: None  Other Topics Concern   Not on file  Social History Narrative   Not on file   Social Determinants of Health   Financial Resource Strain:    Difficulty of Paying Living Expenses: Not on file  Food Insecurity:    Worried About Programme researcher, broadcasting/film/video in the Last Year: Not on file   [employer] of Food in the Last Year: Not on file  Transportation Needs:    Lack of Transportation (Medical): Not on file   Lack of Transportation (Non-Medical): Not on file  Physical Activity:    Days of Exercise per Week: Not on file   Minutes of Exercise per Session: Not on file  Stress:    Feeling of Stress : Not on file  Social Connections:    Frequency of Communication with Friends and Family: Not on file   Frequency of Social Gatherings with Friends and Family: Not on file   Attends Religious Services: Not on file   Active Member of Clubs or Organizations: Not on file   Attends Banker Meetings: Not on file   Marital Status: Not on file  Intimate Partner Violence:    Fear of Current or  Ex-Partner: Not on file   Emotionally Abused: Not on file   Physically Abused: Not on file   Sexually Abused: Not on file    Outpatient Medications Prior to Visit  Medication Sig Dispense Refill   acetaminophen (TYLENOL) 500 MG tablet Take 500 mg by mouth every 6 (six) hours as needed. For pain     ALPRAZolam (XANAX) 0.5 MG tablet Take 1 tablet (0.5 mg total) by mouth at bedtime as needed (For panic symptoms only.). 7 tablet 0   atorvastatin (LIPITOR) 40 MG tablet Take 1 tablet (40 mg total) by mouth daily. 90 tablet 1   Brexpiprazole (REXULTI) 1 MG TABS Take 1 mg by mouth daily.     desvenlafaxine (PRISTIQ) 100 MG 24 hr tablet TAKE 1 TABLET BY MOUTH ONCE DAILY 30 tablet 2   lisinopril-hydrochlorothiazide (ZESTORETIC) 20-25 MG tablet  Take 1 tablet by mouth daily. 90 tablet 3   methylphenidate (RITALIN) 10 MG tablet Take 10 mg by mouth 3 (three) times daily.     omeprazole (PRILOSEC) 20 MG capsule Take 40 mg by mouth daily.     phenazopyridine (PYRIDIUM) 95 MG tablet Take 1 tablet (95 mg total) by mouth 3 (three) times daily as needed for pain. 10 tablet 0   tretinoin (RETIN-A) 0.05 % cream Apply 1 application topically at bedtime.     No facility-administered medications prior to visit.    Allergies  Allergen Reactions   Latex Rash    ROS Review of Systems  Constitutional: Negative.   HENT: Negative.   Eyes: Negative.   Respiratory: Negative.   Cardiovascular: Negative.   Gastrointestinal: Negative.   Genitourinary: Negative.   Musculoskeletal: Negative.   Skin: Negative.   Neurological: Negative.   Psychiatric/Behavioral: Negative.       Objective:    Physical Exam Vitals and nursing note reviewed.  Constitutional:      General: She is not in acute distress.    Appearance: Normal appearance. She is normal weight. She is not ill-appearing, toxic-appearing or diaphoretic.  Cardiovascular:     Rate and Rhythm: Normal rate and regular rhythm.     Heart sounds: Normal heart sounds. No murmur heard.  No friction rub. No gallop.   Pulmonary:     Effort: Pulmonary effort is normal. No respiratory distress.     Breath sounds: Normal breath sounds. No stridor. No wheezing, rhonchi or rales.  Chest:     Chest wall: No tenderness.  Skin:    General: Skin is warm and dry.  Neurological:     General: No focal deficit present.     Mental Status: She is alert and oriented to person, place, and time. Mental status is at baseline.  Psychiatric:        Mood and Affect: Mood normal.        Behavior: Behavior normal.        Thought Content: Thought content normal.        Judgment: Judgment normal.     BP 129/82    Pulse 62    Temp 98 F (36.7 C) (Temporal)    Resp 18    Ht 5\' 9"  (1.753 m)    Wt 177 lb  9.6 oz (80.6 kg)    LMP 10/07/2017    SpO2 97%    BMI 26.23 kg/m  Wt Readings from Last 3 Encounters:  05/12/20 177 lb 9.6 oz (80.6 kg)  11/12/19 176 lb 3.2 oz (79.9 kg)  06/27/19 172 lb (78 kg)  There are no preventive care reminders to display for this patient.  There are no preventive care reminders to display for this patient.  Lab Results  Component Value Date   TSH 0.970 11/12/2019   Lab Results  Component Value Date   WBC 4.7 05/12/2020   HGB 13.0 05/12/2020   HCT 38.9 05/12/2020   MCV 95 05/12/2020   PLT 224 05/12/2020   Lab Results  Component Value Date   NA 137 05/12/2020   K 4.3 05/12/2020   CO2 24 05/12/2020   GLUCOSE 105 (H) 05/12/2020   BUN 15 05/12/2020   CREATININE 0.70 05/12/2020   BILITOT 0.4 11/12/2019   ALKPHOS 117 11/12/2019   AST 20 11/12/2019   ALT 25 11/12/2019   PROT 6.6 11/12/2019   ALBUMIN 4.5 11/12/2019   CALCIUM 9.7 05/12/2020   Lab Results  Component Value Date   CHOL 220 (H) 05/12/2020   Lab Results  Component Value Date   HDL 53 05/12/2020   Lab Results  Component Value Date   LDLCALC 145 (H) 05/12/2020   Lab Results  Component Value Date   TRIG 125 05/12/2020   Lab Results  Component Value Date   CHOLHDL 4.2 05/12/2020   Lab Results  Component Value Date   HGBA1C 5.1 11/12/2019      Assessment & Plan:   Problem List Items Addressed This Visit    None    Visit Diagnoses    Mixed hyperlipidemia    -  Primary   Relevant Orders   Lipid panel (Completed)   Essential hypertension, benign       Relevant Orders   CBC (Completed)   Basic Metabolic Panel (Completed)   Screening for viral disease       Relevant Orders   Hepatitis C antibody (Completed)   HIV antibody (with reflex) (Completed)      No orders of the defined types were placed in this encounter.   Follow-up: No follow-ups on file.   PLAN  Will draw lipid panel to check levels  bp under good control with current regimen  Will follow  up on labs as warranted  Patient encouraged to call clinic with any questions, comments, or concerns.  Janeece Agee, NP

## 2021-02-23 ENCOUNTER — Ambulatory Visit (INDEPENDENT_AMBULATORY_CARE_PROVIDER_SITE_OTHER): Payer: 59 | Admitting: Registered Nurse

## 2021-02-23 ENCOUNTER — Encounter: Payer: Self-pay | Admitting: Registered Nurse

## 2021-02-23 ENCOUNTER — Other Ambulatory Visit: Payer: Self-pay

## 2021-02-23 VITALS — BP 144/79 | HR 64 | Temp 98.3°F | Resp 18 | Ht 69.0 in | Wt 177.4 lb

## 2021-02-23 DIAGNOSIS — R0681 Apnea, not elsewhere classified: Secondary | ICD-10-CM | POA: Diagnosis not present

## 2021-02-23 DIAGNOSIS — Z1231 Encounter for screening mammogram for malignant neoplasm of breast: Secondary | ICD-10-CM | POA: Diagnosis not present

## 2021-02-23 DIAGNOSIS — I10 Essential (primary) hypertension: Secondary | ICD-10-CM | POA: Diagnosis not present

## 2021-02-23 MED ORDER — LISINOPRIL-HYDROCHLOROTHIAZIDE 20-25 MG PO TABS: 1.0000 | ORAL_TABLET | Freq: Every day | ORAL | 3 refills | Status: DC

## 2021-02-23 NOTE — Patient Instructions (Addendum)
Ms. Pottenger -   BP a little high on arrival today, but back to normal by end of visit.  Continue on meds as rxed  See you in 6 mo - can plan to do annual full lab panel at that time, complete physical if you'd like  Order for mammography has been sent - I'll touch base if there are concerning results.  Thank you  Rich     If you have lab work done today you will be contacted with your lab results within the next 2 weeks.  If you have not heard from [place] then please contact us. The fastest way to get your results is to register for My Chart.   IF you received an x-ray today, you will receive an invoice from [facility]. Please contact [facility] at [phone] with questions or concerns regarding your invoice.   IF you received labwork today, you will receive an invoice from [facility]. Please contact [facility] at [phone] with questions or concerns regarding your invoice.   Our billing staff will not be able to assist you with questions regarding bills from these companies.  You will be contacted with the lab results as soon as they are available. The fastest way to get your results is to activate your My Chart account. Instructions are located on the last page of this paperwork. If you have not heard from [place] regarding the results in 2 weeks, please contact this office.

## 2021-02-23 NOTE — Progress Notes (Signed)
Established Patient Office Visit  Subjective:  Patient ID: Kristy Rivas, female    DOB: 1961-02-18  Age: 60 y.o. MRN: [id number]  CC:  Chief Complaint  Patient presents with   Medication Refill    Patient states she is here for Blood Pressure medication. Patient has no other concerns.    HPI Kristy Rivas presents for bp follow up  Hypertension: Patient Currently taking: lisinopril-hctz 20-25mg  PO qd Good effect. No AEs. Denies CV symptoms including: chest pain, shob, doe, headache, visual changes, fatigue, claudication, and dependent edema.   Previous readings and labs: BP Readings from Last 3 Encounters:  02/23/21 (!) 144/79  05/12/20 129/82  11/12/19 134/84   Lab Results  Component Value Date   CREATININE 0.70 05/12/2020    Sleep disturbance Witnessed apnea, snoring, daytime somnolence  Has had sleep study in past with inconclusive results Notes mother and sister both have apnea, mother recently passed of cardiac condition   Otherwise no concerns.   Past Medical History:  Diagnosis Date   ADD (attention deficit disorder) without hyperactivity    Anxiety    Anxiety and depression    Depression    GERD (gastroesophageal reflux disease)    Hypertension     Past Surgical History:  Procedure Laterality Date   FINGER SURGERY      Family History  Problem Relation Age of Onset   Rheum arthritis Mother    Esophageal cancer Father    Breast cancer Neg Hx     Social History   Socioeconomic History   Marital status: Married    Spouse name: Not on file   Number of children: 2   Years of education: Not on file   Highest education level: Not on file  Occupational History   Occupation: data entry  Tobacco Use   Smoking status: Every Day    Packs/day: 0.25    Pack years: 0.00    Types: Cigarettes   Smokeless tobacco: Never  Vaping Use   Vaping Use: Never used  Substance and Sexual Activity   Alcohol use: Yes     Alcohol/week: 0.0 standard drinks    Comment: on occasion   Drug use: Yes    Types: Marijuana    Comment: 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 For pain     ALPRAZolam (XANAX) 0.5 MG tablet Take 1 tablet (0.5 mg total) by mouth at bedtime as needed (For panic symptoms only.). 7 tablet 0   atorvastatin (LIPITOR) 40 MG tablet Take 1 tablet (40 mg total) by mouth daily. 90 tablet 1   brexpiprazole (REXULTI) 1 MG TABS tablet Take 1 mg by mouth daily.     desvenlafaxine (PRISTIQ) 100 MG 24 hr tablet TAKE 1 TABLET BY MOUTH ONCE DAILY 30 tablet 2   methylphenidate (RITALIN) 10 MG tablet Take 10 mg by mouth 3 (  three) times daily.     omeprazole (PRILOSEC) 20 MG capsule Take 40 mg by mouth daily.     phenazopyridine (PYRIDIUM) 95 MG tablet Take 1 tablet (95 mg total) by mouth 3 (three) times daily as needed for pain. 10 tablet 0   tretinoin (RETIN-A) 0.05 % cream Apply 1 application topically at bedtime.     lisinopril-hydrochlorothiazide (ZESTORETIC) 20-25 MG tablet Take 1 tablet by mouth daily. 90 tablet 3   No facility-administered medications prior to visit.    Allergies  Allergen Reactions   Latex Rash    ROS Review of Systems  Constitutional: Negative.   HENT: Negative.    Eyes: Negative.   Respiratory: Negative.    Cardiovascular: Negative.   Gastrointestinal: Negative.   Genitourinary: Negative.    Musculoskeletal: Negative.   Skin: Negative.   Neurological: Negative.   Psychiatric/Behavioral: Negative.    All other systems reviewed and are negative.    Objective:    Physical Exam Vitals and nursing note reviewed.  Constitutional:      General: She is not in acute distress.    Appearance: Normal appearance. She is normal weight. She is not ill-appearing, toxic-appearing or diaphoretic.  Cardiovascular:     Rate and Rhythm: Normal rate and regular rhythm.     Heart sounds: Normal heart sounds. No murmur heard.   No friction rub. No gallop.  Pulmonary:     Effort: Pulmonary effort is normal. No respiratory distress.     Breath sounds: Normal breath sounds. No stridor. No wheezing, rhonchi or rales.  Chest:     Chest wall: No tenderness.  Skin:    General: Skin is warm and dry.  Neurological:     General: No focal deficit present.     Mental Status: She is alert and oriented to person, place, and time. Mental status is at baseline.  Psychiatric:        Mood and Affect: Mood normal.        Behavior: Behavior normal.        Thought Content: Thought content normal.        Judgment: Judgment normal.    BP (!) 144/79   Pulse 64   Temp 98.3 F (36.8 C) (Temporal)   Resp 18   Ht 5\' 9"  (1.753 m)   Wt 177 lb 6.4 oz (80.5 kg)   LMP 10/07/2017   SpO2 97%   BMI 26.20 kg/m  Wt Readings from Last 3 Encounters:  02/23/21 177 lb 6.4 oz (80.5 kg)  05/12/20 177 lb 9.6 oz (80.6 kg)  11/12/19 176 lb 3.2 oz (79.9 kg)     Health Maintenance Due  Topic Date Due   MAMMOGRAM  08/13/2020    There are no preventive care reminders to display for this patient.  Lab Results  Component Value Date   TSH 0.970 11/12/2019   Lab Results  Component Value Date   WBC 4.7 05/12/2020   HGB 13.0 05/12/2020   HCT 38.9 05/12/2020   MCV 95 05/12/2020   PLT 224 05/12/2020   Lab Results  Component Value Date   NA 137 05/12/2020   K 4.3 05/12/2020   CO2 24 05/12/2020   GLUCOSE 105  (H) 05/12/2020   BUN 15 05/12/2020   CREATININE 0.70 05/12/2020   BILITOT 0.4 11/12/2019   ALKPHOS 117 11/12/2019   AST 20 11/12/2019   ALT 25 11/12/2019   PROT 6.6 11/12/2019   ALBUMIN 4.5 11/12/2019   CALCIUM 9.7 05/12/2020  Lab Results  Component Value Date   CHOL 220 (H) 05/12/2020   Lab Results  Component Value Date   HDL 53 05/12/2020   Lab Results  Component Value Date   LDLCALC 145 (H) 05/12/2020   Lab Results  Component Value Date   TRIG 125 05/12/2020   Lab Results  Component Value Date   CHOLHDL 4.2 05/12/2020   Lab Results  Component Value Date   HGBA1C 5.1 11/12/2019      Assessment & Plan:   Problem List Items Addressed This Visit   None Visit Diagnoses     Screening mammogram for breast cancer    -  Primary   Relevant Orders   MM Digital Screening   Essential hypertension, benign       Relevant Medications   lisinopril-hydrochlorothiazide (ZESTORETIC) 20-25 MG tablet   Witnessed episode of apnea       Relevant Orders   Ambulatory referral to Neurology       Meds ordered this encounter  Medications   lisinopril-hydrochlorothiazide (ZESTORETIC) 20-25 MG tablet    Sig: Take 1 tablet by mouth daily.    Dispense:  90 tablet    Refill:  3     Follow-up: Return in about 6 months (around 08/25/2021) for htn / labs.   PLAN Refill meds x 6 mo Labs and CPE at next visit Refer to neuro for sleep study Order for mammography placed Patient encouraged to call clinic with any questions, comments, or concerns. Janeece Agee, NP

## 2021-04-26 ENCOUNTER — Encounter: Payer: Self-pay | Admitting: Neurology

## 2021-04-26 ENCOUNTER — Ambulatory Visit (INDEPENDENT_AMBULATORY_CARE_PROVIDER_SITE_OTHER): Payer: 59 | Admitting: Neurology

## 2021-04-26 VITALS — BP 130/83 | HR 72 | Ht 68.75 in | Wt 174.5 lb

## 2021-04-26 DIAGNOSIS — R0683 Snoring: Secondary | ICD-10-CM

## 2021-04-26 DIAGNOSIS — F3342 Major depressive disorder, recurrent, in full remission: Secondary | ICD-10-CM

## 2021-04-26 DIAGNOSIS — M2619 Other specified anomalies of jaw-cranial base relationship: Secondary | ICD-10-CM

## 2021-04-26 DIAGNOSIS — R5382 Chronic fatigue, unspecified: Secondary | ICD-10-CM | POA: Diagnosis not present

## 2021-04-26 DIAGNOSIS — G478 Other sleep disorders: Secondary | ICD-10-CM | POA: Diagnosis not present

## 2021-04-26 NOTE — Patient Instructions (Signed)
Screening for Sleep Apnea Sleep apnea is a condition in which breathing pauses or becomes shallow during sleep. Sleep apnea screening is a test to determine if you are at risk for sleep apnea. The test includes a series of questions. It will only takes a few minutes. Your health care provider may ask you to have this test in preparation for surgery or as part of a physical exam. What are the symptoms of sleep apnea? Common symptoms of sleep apnea include: Snoring. Waking up often at night. Daytime sleepiness. Pauses in breathing. Choking or gasping during sleep. Irritability. Forgetfulness. Trouble thinking clearly. Depression. Personality changes. Most people with sleep apnea do not know that they have it. What are the advantages of sleep apnea screening? Getting screened for sleep apnea can help: Ensure your safety. It is important for your health care providers to know whether or not you have sleep apnea, especially if you are having surgery or have other long-term (chronic) health conditions. Improve your health and allow you to get a better night's rest. Restful sleep can help you: Have more energy. Lose weight. Improve high blood pressure. Improve diabetes management. Prevent stroke. Prevent car accidents. What happens during the screening? Screening usually includes being asked a list of questions about your sleep quality. Some questions you may be asked include: Do you snore? Is your sleep restless? Do you have daytime sleepiness? Has a partner or spouse told you that you stop breathing during sleep? Have you had trouble concentrating or memory loss? What is your age? What is your neck circumference? To measure your neck, keep your back straight and gently wrap the tape measure around your neck. Put the tape measure at the middle of your neck, between your chin and collarbone. What is your sex assigned at birth? Do you have or are you being treated for high blood  pressure? If your screening test is positive, you are at risk for the condition. Further testing may be needed to confirm a diagnosis of sleep apnea. Where to find more information You can find screening tools online or at your health care clinic. For more information about sleep apnea screening and healthy sleep, visit these websites: Centers for Disease Control and Prevention: www.cdc.gov American Sleep Apnea Association: www.sleepapnea.org Contact a health care provider if: You think that you may have sleep apnea. Summary Sleep apnea screening can help determine if you are at risk for sleep apnea. It is important for your health care providers to know whether or not you have sleep apnea, especially if you are having surgery or have other chronic health conditions. You may be asked to take a screening test for sleep apnea in preparation for surgery or as part of a physical exam. This information is not intended to replace advice given to you by your health care provider. Make sure you discuss any questions you have with your health care provider. Document Revised: 07/30/2020 Document Reviewed: 07/30/2020 Elsevier Patient Education  2022 Elsevier Inc.  

## 2021-04-26 NOTE — Progress Notes (Signed)
SLEEP MEDICINE CLINIC    Provider:  Melvyn Novas, MD  Primary Care Physician:  Janeece Agee, NP [address] [place] [address]     Referring Provider: Janeece Agee, Np [address] [place],  [place] [address]          Chief Complaint according to patient   Patient presents with:     New Patient (Initial Visit)     Psychiatrist is now Dr. Evelene Croon.       HISTORY OF PRESENT ILLNESS:  Kristy Rivas is a 60 y.o. year old White or Caucasian female patient seen here as a referral on 04/26/2021 from PCP for a sleep test.  Chief concern according to patient :   Internal referral for witnessed apnea and daytime somnolence. Pt would like to have a ss test. Pt did have a ss 8 years ago and belives it was inaccurate. Pt states she chokes , which wakes her up middle of night, once a month, and is always tired. Goes to bed at 9pm and wakes up at 7am. Pt states she used to sleep walk.  She has been told of her loud snoring. Pt tends to sleep on back and side.    I have the pleasure of seeing Kristy Rivas 04-26-2021, a right -handed Caucasian female with a possible sleep disorder.  She  has a past medical history of ADD (attention deficit disorder) without hyperactivity, Anxiety, Major Depression, history of suicidal ideation, GERD (gastroesophageal reflux disease), and Hypertension..    The patient had the first sleep study in the year 2009, and could not sleep. [facility] was referring.    Sleep relevant medical history: Psychotics breaks, bipolar,  sleep walking I childhood,  Tonsillectomy at age 75. Cyclic hypersomnia, and treated for insomnia.    Family medical /sleep history: sister, mother ( died of atrial fib, CHF ) with OSA, insomnia, sleep walkers.    Social history:  Patient is working in an office-  and lives in a household alone with 2 cats  Family status is divorced , with 2 grown daughters, 2 grandchildren.  The patient  currently works/  Tobacco use 1/2 ppd .  ETOH use not a lot- cut down.  Caffeine intake in form of Coffee( 3 cups a day) Soda( /) Tea ( /) or energy drinks. Regular exercise: none .         Sleep habits are as follows: The patient's dinner time is between variable  PM. The patient goes to bed at 9 PM and continues to sleep for 10 hours, wakes for 1-2 bathroom breaks. The preferred sleep position is back and sides, with the support of 1 pillow.  Dreams are reportedly frequent/vivid. All night long.  7  AM is the usual rise time. The patient wakes up with many alarms   She reports not feeling refreshed or restored in AM, with symptoms such as dry mouth, morning headaches, and residual fatigue. Naps are avoided- less refreshing than nocturnal sleep.    Review of Systems: Out of a complete 14 system review, the patient complains of only the following symptoms, and all other reviewed systems are negative.:  Fatigue, sleepiness , snoring, fragmented sleep, Insomnia treated with benzos.    How likely are you to doze in the following situations: 0 = not likely, 1 = slight chance, 2 = moderate chance, 3 = high chance   Sitting and Reading? Watching Television? Sitting  inactive in a public place (theater or meeting)? As a passenger in a car for an hour without a break? Lying down in the afternoon when circumstances permit? Sitting and talking to someone? Sitting quietly after lunch without alcohol? In a car, while stopped for a few minutes in traffic?   Total = 5/ 24 points on ritalin   FSS endorsed at 60/ 63 points.   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, in remission   Sexual activity: Yes    Birth control/protection: None  Other Topics Concern   Not on file  Social History Narrative   Lives alone   Right handed   Caffeine: 3 cups of coffee a day, soda 1qotherday   Social Determinants of Health   Financial Resource Strain: Not on file  Food Insecurity: Not on file  Transportation Needs: Not on file  Physical Activity: Not on file  Stress: Not on file  Social Connections: Not on file    Family History  Problem Relation Age of Onset   Rheum arthritis Mother    Depression Mother    Sleep apnea Mother    Arrhythmia Mother    Esophageal cancer Father    Heart attack Sister    Diabetes Sister    Heart Problems Sister    Breast cancer Neg Hx     Past Medical History:  Diagnosis Date   ADD (attention deficit disorder) without hyperactivity    Anxiety    Anxiety and depression    Depression    GERD (gastroesophageal reflux disease)    Hypertension     Past Surgical History:  Procedure Laterality Date   FINGER SURGERY       Current Outpatient Medications on File Prior to Visit  Medication Sig Dispense Refill   acetaminophen (TYLENOL) 500 MG tablet Take 500 mg by mouth every 6 (six) hours as needed. For pain     ALPRAZolam (XANAX) 0.5 MG tablet Take 1 tablet (0.5 mg total) by mouth at bedtime as needed (For panic symptoms only.). 7 tablet 0   atorvastatin (LIPITOR) 40 MG tablet Take 1 tablet (40 mg total) by mouth daily. 90 tablet 1   brexpiprazole (REXULTI) 1 MG TABS tablet Take 1 mg by mouth daily.     desvenlafaxine (PRISTIQ) 100 MG 24 hr tablet TAKE 1 TABLET BY MOUTH ONCE DAILY 30 tablet 2   lisinopril-hydrochlorothiazide (ZESTORETIC) 20-25 MG tablet Take 1 tablet by mouth daily. 90 tablet 3    methylphenidate (RITALIN) 10 MG tablet Take 10 mg by mouth 3 (three) times daily.     omeprazole (PRILOSEC) 20 MG capsule Take 40 mg by mouth daily.     tretinoin (RETIN-A) 0.05 % cream Apply 1 application topically at bedtime.     No current facility-administered medications on file prior to visit.  Allergies  Allergen Reactions   Latex Rash    Physical exam:  Today's Vitals   04/26/21 0957  BP: 130/83  Pulse: 72  Weight: 174 lb 8 oz (79.2 kg)  Height: 5' 8.75" (1.746 m)   Body mass index is 25.96 kg/m.   Wt Readings from Last 3 Encounters:  04/26/21 174 lb 8 oz (79.2 kg)  02/23/21 177 lb 6.4 oz (80.5 kg)  05/12/20 177 lb 9.6 oz (80.6 kg)     Ht Readings from Last 3 Encounters:  04/26/21 5' 8.75" (1.746 m)  02/23/21 5\' 9"  (1.753 m)  05/12/20 5\' 9"  (1.753 m)      General: The patient is awake, alert and appears not in acute distress. The patient is well groomed. Head: Normocephalic, atraumatic. Neck is supple. Mallampati 3,  neck circumference:14 inches . Nasal airflow  patent.  Retrognathia is  seen.  Dental status: gappy- braces, small crowded lower jaw.  Cardiovascular:  Regular rate and cardiac rhythm by pulse,  without distended neck veins. Respiratory: Lungs are clear to auscultation.  Skin:  Without evidence of ankle edema, or rash. Trunk: The patient's posture is erect.   Neurologic exam : The patient is awake and alert, oriented to place and time.   Memory subjective described as intact.  Attention span & concentration ability appears normal.  Speech is fluent,  without  dysarthria, dysphonia or aphasia.  Mood and affect are appropriate.   Cranial nerves: no loss of smell or taste reported  Pupils are equal and briskly reactive to light. Funduscopic exam deferred. .  Extraocular movements in vertical and horizontal planes were intact and without nystagmus. No Diplopia. Visual fields by finger perimetry are intact. Hearing was intact to soft voice and  finger rubbing.    Facial sensation intact to fine touch.  Facial motor strength is symmetric and tongue and uvula move midline.  Neck ROM : rotation, tilt and flexion extension were normal for age and shoulder shrug was symmetrical.    Motor exam:  Symmetric bulk, tone and ROM.   Normal tone without cog wheeling, symmetric grip strength .   Sensory:  Fine touch and vibration were normal.  Proprioception tested in the upper extremities was normal.   Coordination: Rapid alternating movements in the fingers/hands were of normal speed.  The Finger-to-nose maneuver was intact without evidence of ataxia, dysmetria or tremor.   Gait and station: Patient could rise unassisted from a seated position, walked without assistive device.  Stance is of normal width/ base and the patient turned with 3 steps.  Toe and heel walk were deferred.  Deep tendon reflexes: in the  upper and lower extremities are symmetric and intact.  Babinski response was deferred .       After spending a total time of 50 minutes face to face and additional time for physical and neurologic examination, review of laboratory studies,  personal review of imaging studies, reports and results of other testing and review of referral information / records as far as provided in visit, I have established the following assessments:  1) Longstanding mental health history with sleep impairment. Medicated for insomnia, and sleeping long uninterrupted hours, yet not refreshed.   2) difficulties to sleep in a strange environment- may be best tested by HST. [facility] she would absolutely need a female tech.   3) no physical illness.   I would like to thank Dr Evelene CroonKaur, MD, for allowing me to meet with  and to take care of this pleasant patient.   In short, Kristy Rivas is presenting with insomnia, anxiety, depression, I plan to follow up either personally or through our NP within 2-4  months if an  abnormality on sleep test has been found. .   CC: I will share my notes with PCP.  Electronically signed by: Melvyn Novas, MD 04/26/2021 10:16 AM  [facility] Board certified by The [employer] of Sleep Medicine and Diplomate of the [employer] of Sleep Medicine. Board certified In Neurology through the ABPN, Fellow of the [employer] of Neurology. Medical Director of [facility].

## 2021-04-27 ENCOUNTER — Ambulatory Visit
Admission: RE | Admit: 2021-04-27 | Discharge: 2021-04-27 | Disposition: A | Discharge status: Home / Self Care | Payer: 59 | Source: Ambulatory Visit | Attending: Registered Nurse | Admitting: Registered Nurse

## 2021-04-27 ENCOUNTER — Other Ambulatory Visit: Payer: Self-pay

## 2021-04-27 DIAGNOSIS — Z1231 Encounter for screening mammogram for malignant neoplasm of breast: Secondary | ICD-10-CM

## 2021-05-02 ENCOUNTER — Telehealth: Payer: Self-pay

## 2021-05-02 NOTE — Telephone Encounter (Signed)
I called pt to get her scheduled for hst. She said she is leaving her job and will lose her insurance soon. She will call back with new insurance info when she gets it.

## 2021-08-22 ENCOUNTER — Encounter: Payer: 59 | Admitting: Registered Nurse

## 2021-08-23 ENCOUNTER — Encounter: Payer: 59 | Admitting: Registered Nurse

## 2022-03-16 ENCOUNTER — Telehealth: Payer: Self-pay | Admitting: Registered Nurse

## 2022-03-16 ENCOUNTER — Other Ambulatory Visit: Payer: Self-pay

## 2022-03-16 DIAGNOSIS — I10 Essential (primary) hypertension: Secondary | ICD-10-CM

## 2022-03-16 MED ORDER — LISINOPRIL-HYDROCHLOROTHIAZIDE 20-25 MG PO TABS: 1.0000 | ORAL_TABLET | Freq: Every day | ORAL | 3 refills | Status: DC

## 2022-03-16 NOTE — Telephone Encounter (Signed)
Pt has an appt on 04/03/22. Pt called stating that she is out of lisinopril-hydrochlorothiazide 20-25 mg. Pt wants to know if she can get a partial refill until her appt. PT pharmacy is [facility] on [address].

## 2022-04-03 ENCOUNTER — Encounter: Payer: Self-pay | Admitting: Registered Nurse

## 2022-04-03 ENCOUNTER — Ambulatory Visit (INDEPENDENT_AMBULATORY_CARE_PROVIDER_SITE_OTHER): Payer: Self-pay | Admitting: Registered Nurse

## 2022-04-03 ENCOUNTER — Other Ambulatory Visit: Payer: Self-pay

## 2022-04-03 DIAGNOSIS — I10 Essential (primary) hypertension: Secondary | ICD-10-CM

## 2022-04-03 DIAGNOSIS — F341 Dysthymic disorder: Secondary | ICD-10-CM

## 2022-04-03 MED ORDER — LISINOPRIL-HYDROCHLOROTHIAZIDE 20-25 MG PO TABS: 1.0000 | ORAL_TABLET | Freq: Every day | ORAL | 3 refills | Status: DC

## 2022-04-03 NOTE — Assessment & Plan Note (Signed)
Doing well with psychiatry. She will continue to follow with them.

## 2022-04-03 NOTE — Assessment & Plan Note (Signed)
Stable on current regimen. Continue. Follow up in 1 year. Labs collected. Will follow up with the patient as warranted.

## 2022-04-03 NOTE — Progress Notes (Signed)
Established Patient Office Visit  Subjective:  Patient ID: Kristy Rivas, female    DOB: [birth year]  Age: 61 y.o. MRN: [id number]  CC: No chief complaint on file.   HPI Kristy Rivas presents for HTN  Hypertension: Patient Currently taking: lisinopril-hctz 20-25mg  po qd Good effect. No AEs. Denies CV symptoms including: chest pain, shob, doe, headache, visual changes, fatigue, claudication, and dependent edema.   Previous readings and labs: BP Readings from Last 3 Encounters:  04/03/22 124/72  04/26/21 130/83  02/23/21 (!) 144/79   Lab Results  Component Value Date   CREATININE 0.70 05/12/2020    Dysthymia Doing better. Follows with psychiatry Notes abilify made a huge difference for her.  Outpatient Medications Prior to Visit  Medication Sig Dispense Refill   acetaminophen (TYLENOL) 500 MG tablet Take 500 mg by mouth every 6 (six) hours as needed. For pain     ALPRAZolam (XANAX) 0.5 MG tablet Take 1 tablet (0.5 mg total) by mouth at bedtime as needed (For panic symptoms only.). 7 tablet 0   ARIPiprazole (ABILIFY) 5 MG tablet Take 5 mg by mouth at bedtime.     desvenlafaxine (PRISTIQ) 100 MG 24 hr tablet TAKE 1 TABLET BY MOUTH ONCE DAILY 30 tablet 2   Desvenlafaxine ER 100 MG TB24 Take 1 tablet by mouth every morning.     methylphenidate (RITALIN) 20 MG tablet Take 20 mg by mouth 3 (three) times daily.     omeprazole (PRILOSEC) 20 MG capsule Take 40 mg by mouth daily.     tretinoin (RETIN-A) 0.05 % cream Apply 1 application topically at bedtime.     brexpiprazole (REXULTI) 1 MG TABS tablet Take 1 mg by mouth daily.     lisinopril-hydrochlorothiazide (ZESTORETIC) 20-25 MG tablet Take 1 tablet by mouth daily. 90 tablet 3   losartan (COZAAR) 100 MG tablet Take 1 tablet by mouth daily.     atorvastatin (LIPITOR) 40 MG tablet Take 1 tablet (40 mg total) by mouth daily. (Patient not taking: Reported on 04/03/2022) 90 tablet 1   methylphenidate (RITALIN)  10 MG tablet Take 10 mg by mouth 3 (three) times daily. (Patient not taking: Reported on 04/03/2022)     No facility-administered medications prior to visit.    Review of Systems  Constitutional: Negative.   HENT: Negative.    Eyes: Negative.   Respiratory: Negative.    Cardiovascular: Negative.   Gastrointestinal: Negative.   Genitourinary: Negative.   Musculoskeletal: Negative.   Skin: Negative.   Neurological: Negative.   Psychiatric/Behavioral: Negative.    All other systems reviewed and are negative.     Objective:     BP 124/72   Pulse 80   Temp 98 F (36.7 C) (Temporal)   Resp 18   Ht 5' 8.75" (1.746 m)   Wt 191 lb 12.8 oz (87 kg)   LMP 10/07/2017   SpO2 99%   BMI 28.53 kg/m   Wt Readings from Last 3 Encounters:  04/03/22 191 lb 12.8 oz (87 kg)  04/26/21 174 lb 8 oz (79.2 kg)  02/23/21 177 lb 6.4 oz (80.5 kg)   Physical Exam Vitals and nursing note reviewed.  Constitutional:      General: She is not in acute distress.    Appearance: Normal appearance. She is normal weight. She is not ill-appearing, toxic-appearing or diaphoretic.  Cardiovascular:     Rate and Rhythm: Normal rate and regular rhythm.     Heart sounds: Normal heart sounds. No murmur heard.  No friction rub. No gallop.  Pulmonary:     Effort: Pulmonary effort is normal. No respiratory distress.     Breath sounds: Normal breath sounds. No stridor. No wheezing, rhonchi or rales.  Chest:     Chest wall: No tenderness.  Skin:    General: Skin is warm and dry.  Neurological:     General: No focal deficit present.     Mental Status: She is alert and oriented to person, place, and time. Mental status is at baseline.  Psychiatric:        Mood and Affect: Mood normal.        Behavior: Behavior normal.        Thought Content: Thought content normal.        Judgment: Judgment normal.     No results found for any visits on 04/03/22.    The 10-year ASCVD risk score (Arnett DK, et al.,  2019) is: 8.9%    Assessment & Plan:   Problem List Items Addressed This Visit       Cardiovascular and Mediastinum   Essential hypertension, benign    Stable on current regimen. Continue. Follow up in 1 year. Labs collected. Will follow up with the patient as warranted.       Relevant Medications   lisinopril-hydrochlorothiazide (ZESTORETIC) 20-25 MG tablet   Other Relevant Orders   Comprehensive metabolic panel   Lipid panel     Other   Dysthymia    Doing well with psychiatry. She will continue to follow with them.       Relevant Medications   Desvenlafaxine ER 100 MG TB24    Meds ordered this encounter  Medications   lisinopril-hydrochlorothiazide (ZESTORETIC) 20-25 MG tablet    Sig: Take 1 tablet by mouth daily.    Dispense:  90 tablet    Refill:  3    Order Specific Question:   Supervising Provider    Answer:   Neva Seat, JEFFREY R [[id number]]    No follow-ups on file.    Janeece Agee, NP

## 2022-04-03 NOTE — Patient Instructions (Addendum)
Ms. Kristy Rivas to see you!  Glad mental health is in a good place.  I have refilled medications  Call if you need anything  Thanks,  Rich   I recommend these providers:  Jarold Motto, PA Glenetta Hew, MD Edwina Barth, MD Letta Moynahan Early, NP Jiles Prows, DNP     If you have lab work done today you will be contacted with your lab results within the next 2 weeks.  If you have not heard from [place] then please contact us. The fastest way to get your results is to register for My Chart.   IF you received an x-ray today, you will receive an invoice from [facility]. Please contact [facility] at [phone] with questions or concerns regarding your invoice.   IF you received labwork today, you will receive an invoice from [facility]. Please contact [facility] at [phone] with questions or concerns regarding your invoice.   Our billing staff will not be able to assist you with questions regarding bills from these companies.  You will be contacted with the lab results as soon as they are available. The fastest way to get your results is to activate your My Chart account. Instructions are located on the last page of this paperwork. If you have not heard from [place] regarding the results in 2 weeks, please contact this office.

## 2022-04-04 LAB — COMPREHENSIVE METABOLIC PANEL
ALT: 40 U/L — ABNORMAL HIGH (ref 0–35)
AST: 22 U/L (ref 0–37)
Albumin: 4.6 g/dL (ref 3.5–5.2)
Alkaline Phosphatase: 89 U/L (ref 39–117)
BUN: 14 mg/dL (ref 6–23)
CO2: 29 mEq/L (ref 19–32)
Calcium: 9.8 mg/dL (ref 8.4–10.5)
Chloride: 97 mEq/L (ref 96–112)
Creatinine, Ser: 0.76 mg/dL (ref 0.40–1.20)
GFR: 85.52 mL/min (ref >60.00)
Glucose, Bld: 101 mg/dL — ABNORMAL HIGH (ref 70–99)
Potassium: 3.5 mEq/L (ref 3.5–5.1)
Sodium: 135 mEq/L (ref 135–145)
Total Bilirubin: 0.4 mg/dL (ref 0.2–1.2)
Total Protein: 7.6 g/dL (ref 6.0–8.3)

## 2022-04-04 LAB — LIPID PANEL
Cholesterol: 258 mg/dL — ABNORMAL HIGH (ref 0–200)
HDL: 51.2 mg/dL (ref >39.00)
NonHDL: 207.19
Total CHOL/HDL Ratio: 5
Triglycerides: 204 mg/dL — ABNORMAL HIGH (ref 0.0–149.0)
VLDL: 40.8 mg/dL — ABNORMAL HIGH (ref 0.0–40.0)

## 2022-04-04 LAB — LDL CHOLESTEROL, DIRECT: Direct LDL: 166 mg/dL

## 2022-05-01 IMAGING — MG DIGITAL SCREENING BILAT W/ CAD
4 series · 4 of 4 positions shown · non-contrast
Comparison: Previous exam(s).

CLINICAL DATA: Screening.

EXAM:
DIGITAL SCREENING BILATERAL MAMMOGRAM WITH CAD
TECHNIQUE: Bilateral screening digital craniocaudal and mediolateral oblique
mammograms were obtained. The images were evaluated with
computer-aided detection.

[L CC]
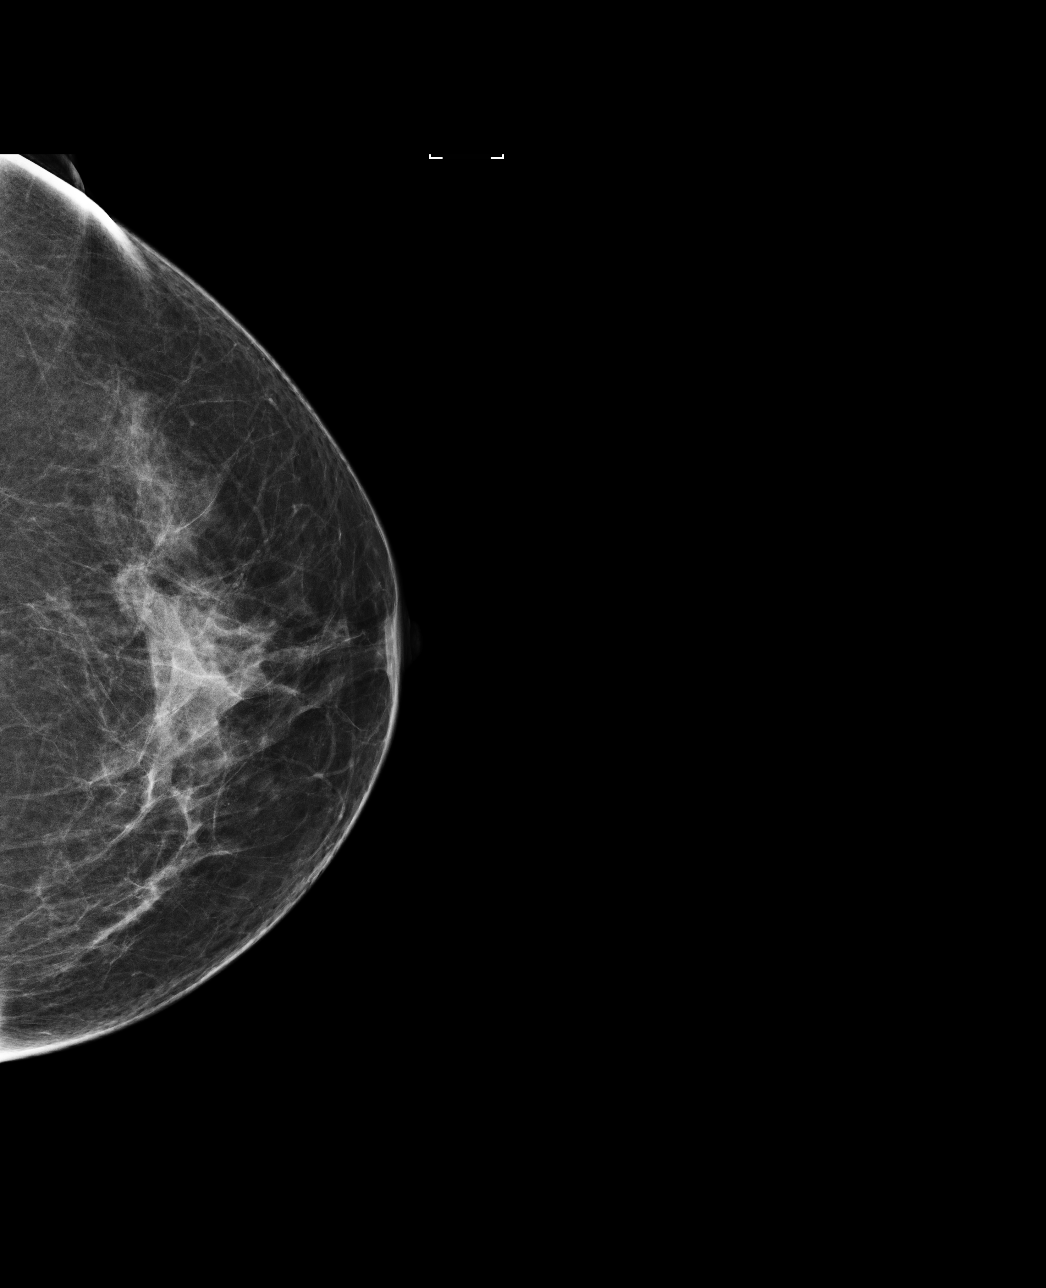

[R MLO]
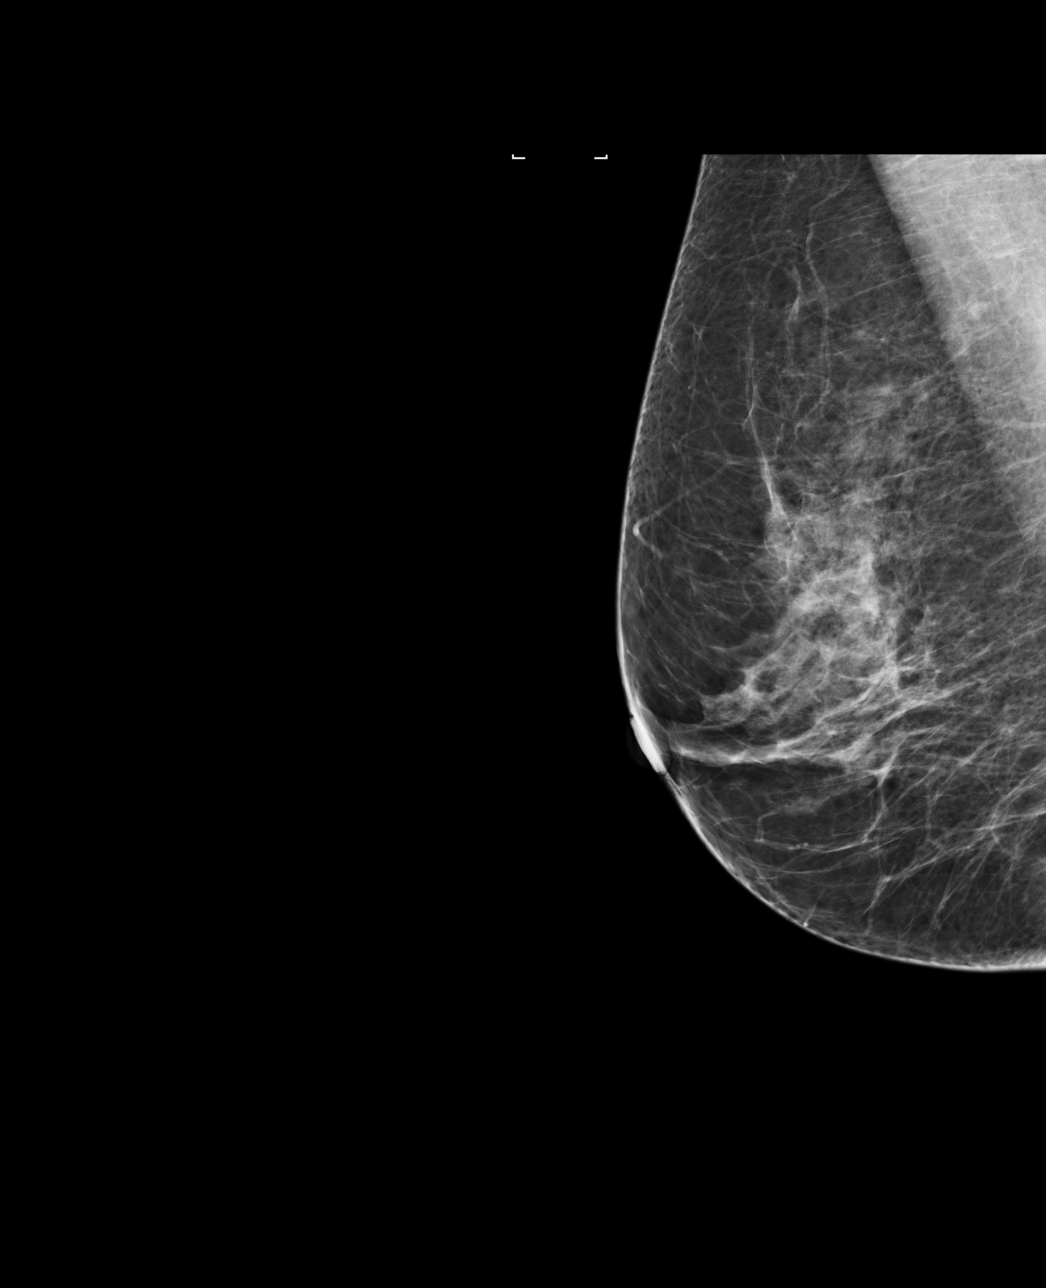

[L MLO]
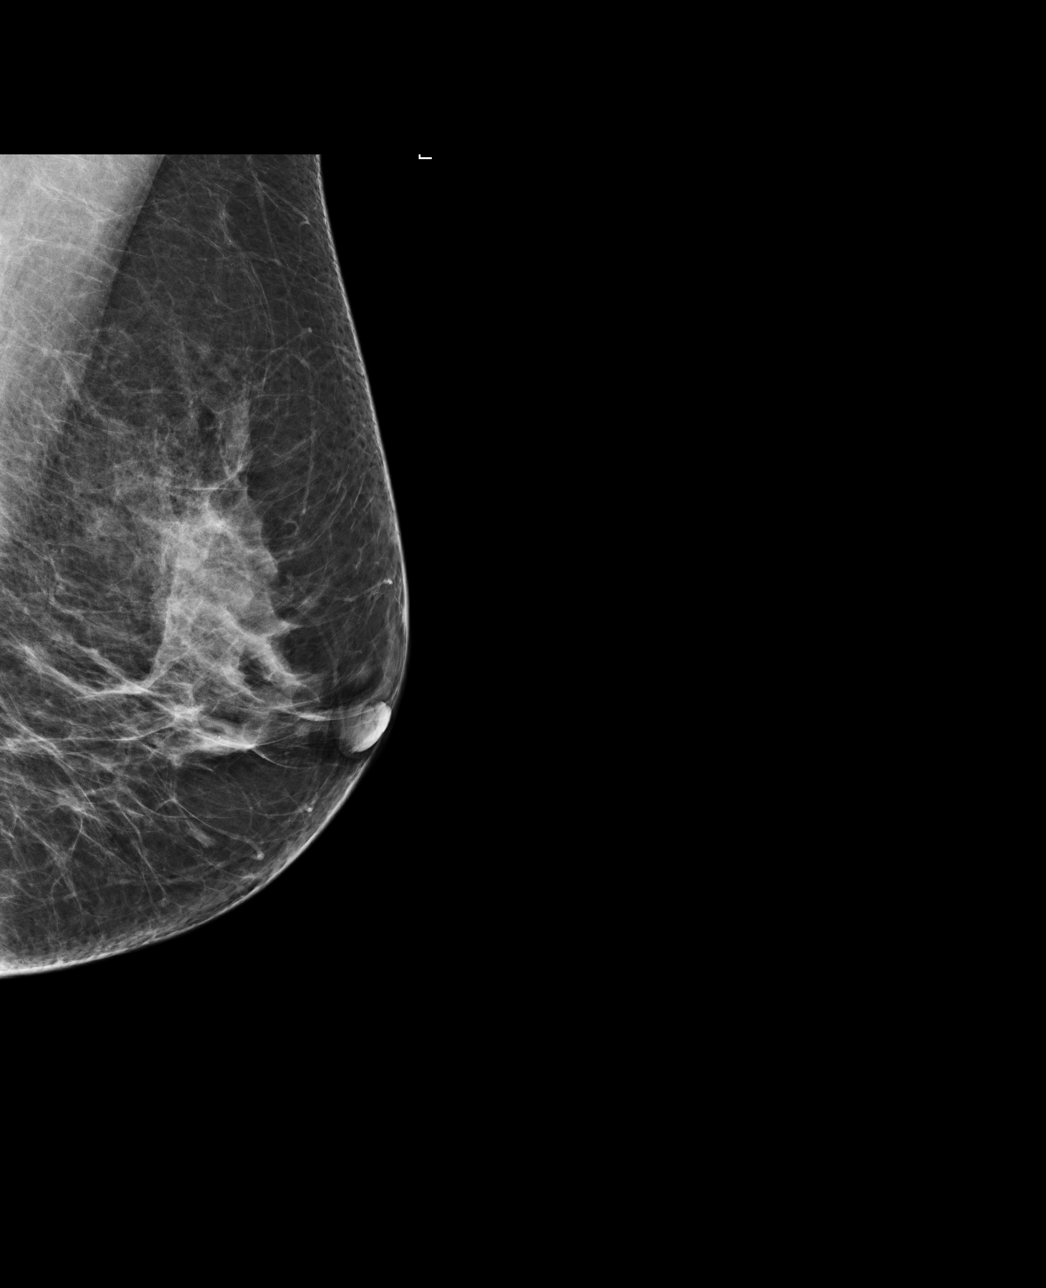

[R CC]
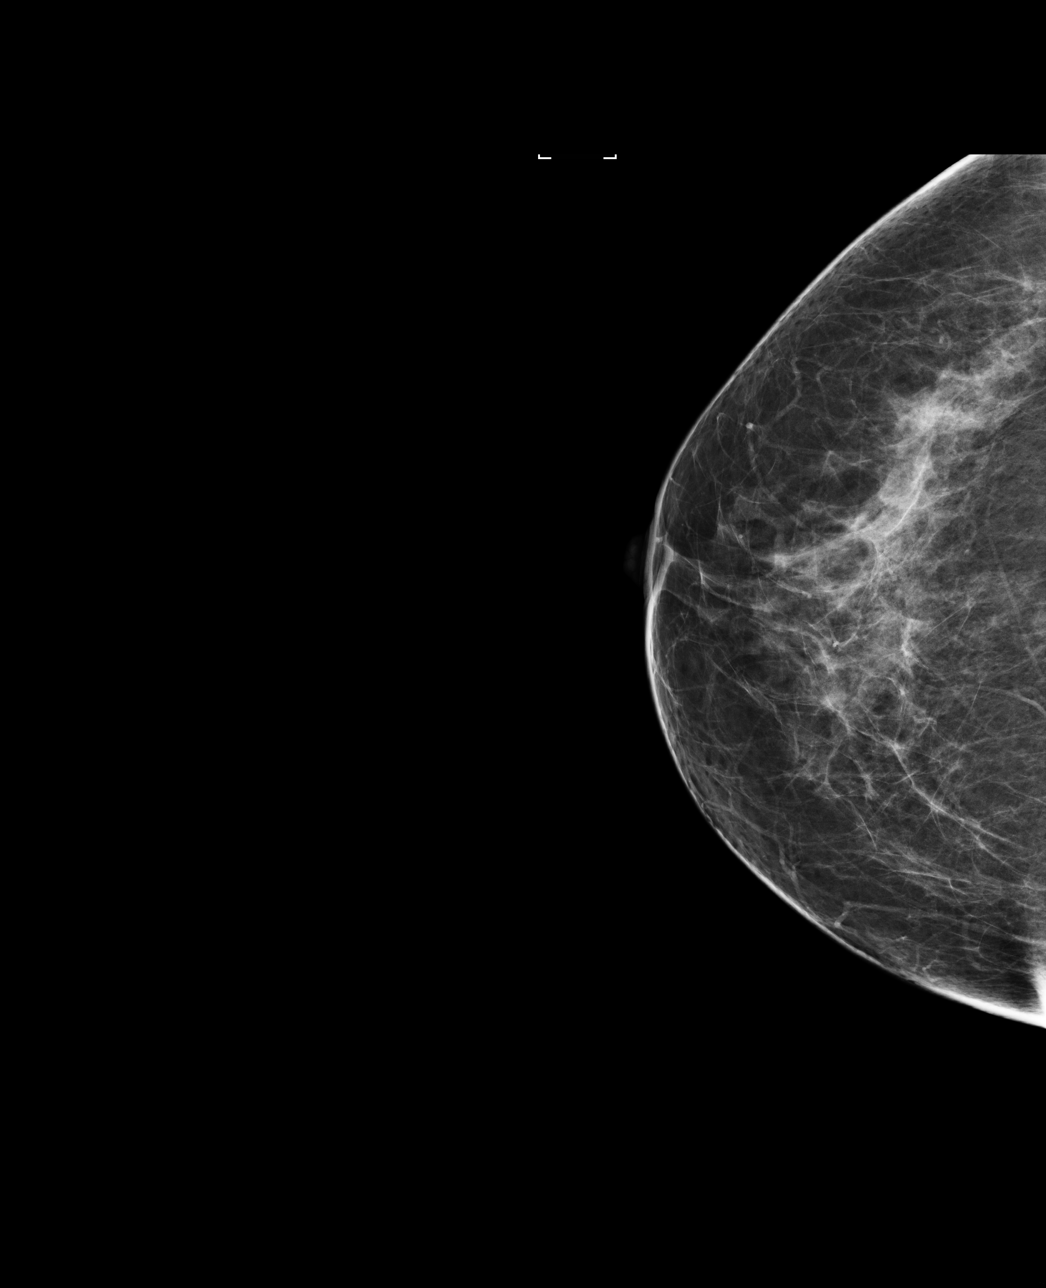

[4 of 4 positions shown; findings below may reference images not displayed]

ACR Breast Density Category b: There are scattered areas of
fibroglandular density.
FINDINGS: There are no findings suspicious for malignancy.
IMPRESSION: No mammographic evidence of malignancy. A result letter of this
screening mammogram will be mailed directly to the patient.

RECOMMENDATION:
Screening mammogram in one year. (Code:WO-V-ZRK)

BI-RADS CATEGORY  1: Negative.

## 2023-04-21 ENCOUNTER — Encounter (HOSPITAL_COMMUNITY): Payer: Self-pay | Admitting: Emergency Medicine

## 2023-04-21 ENCOUNTER — Ambulatory Visit (HOSPITAL_COMMUNITY)
Admission: EM | Admit: 2023-04-21 | Discharge: 2023-04-21 | Disposition: A | Discharge status: Home / Self Care | Payer: Self-pay | Attending: Physician Assistant | Admitting: Physician Assistant

## 2023-04-21 DIAGNOSIS — N3001 Acute cystitis with hematuria: Secondary | ICD-10-CM | POA: Insufficient documentation

## 2023-04-21 DIAGNOSIS — Z79899 Other long term (current) drug therapy: Secondary | ICD-10-CM | POA: Insufficient documentation

## 2023-04-21 DIAGNOSIS — F17210 Nicotine dependence, cigarettes, uncomplicated: Secondary | ICD-10-CM | POA: Insufficient documentation

## 2023-04-21 LAB — POCT URINALYSIS DIP (MANUAL ENTRY)
Bilirubin, UA: NEGATIVE
Glucose, UA: 100 mg/dL — AB
Ketones, POC UA: NEGATIVE mg/dL
Nitrite, UA: POSITIVE — AB
Spec Grav, UA: 1.015 (ref 1.010–1.025)
Urobilinogen, UA: 1 E.U./dL
pH, UA: 7 (ref 5.0–8.0)

## 2023-04-21 MED ORDER — SULFAMETHOXAZOLE-TRIMETHOPRIM 800-160 MG PO TABS: 1.0000 | ORAL_TABLET | Freq: Two times a day (BID) | ORAL | 0 refills | Status: DC

## 2023-04-21 NOTE — ED Notes (Signed)
Called for patient in lobby, no answer.  

## 2023-04-21 NOTE — ED Provider Notes (Signed)
[facility]    CSN: [id number] Arrival date & time: 04/21/23  1631      History   Chief Complaint Chief Complaint  Patient presents with   Back Pain   Dysuria    HPI Kristy Rivas is a 62 y.o. female.   Patient presents today with a 3-day history of UTI symptoms.  She reports dysuria, frequency, urgency, lower back pain.  She reports that lower back pain is rated 7 on a 0-10 pain scale, described as aching, no aggravating or alleviating factors identified.  Denies any known injury or increase in activity prior to symptom onset.  She has been taking Azo as well as pushing fluids and drinking cranberry juice without improvement of symptoms.  She denies history of recurrent UTI.  Denies any recent antibiotics.  She denies history of diabetes does not take SGLT2 inhibitor.  Denies history of nephrolithiasis and has never seen a urologist.  Denies any recent urogenital procedure or self-catheterization.    Past Medical History:  Diagnosis Date   ADD (attention deficit disorder) without hyperactivity    Anxiety    Anxiety and depression    Depression    GERD (gastroesophageal reflux disease)    Hypertension     Patient Active Problem List   Diagnosis Date Noted   Essential hypertension, benign 04/03/2022   Non-restorative sleep 04/26/2021   Recurrent major depressive disorder, in full remission (HCC) 04/26/2021   Chronic fatigue 04/26/2021   LGSIL on Pap smear of cervix 09/20/2018   CYSTIC ACNE 01/12/2009   Dysthymia 11/11/2008   ATTENTION DEFICIT DISORDER, ADULT 11/11/2008   OTHER SPECIFIED CONDITIONS OF THE TONGUE 11/11/2008   DRUG ABUSE, HX OF 11/11/2008    Past Surgical History:  Procedure Laterality Date   FINGER SURGERY      OB History     Gravida  3   Para  2   Term  2   Preterm      AB  1   Living  2      SAB  1   IAB      Ectopic      Multiple      Live Births  2            Home Medications    Prior to  Admission medications   Medication Sig Start Date End Date Taking? Authorizing Provider  acetaminophen (TYLENOL) 500 MG tablet Take 500 mg by mouth every 6 (six) hours as needed. For pain   Yes [provider]  ALPRAZolam (XANAX) 0.5 MG tablet Take 1 tablet (0.5 mg total) by mouth at bedtime as needed (For panic symptoms only.). 12/08/16  Yes Ofilia Neas, PA-C  amphetamine-dextroamphetamine (ADDERALL) 20 MG tablet Take 20 mg by mouth 2 (two) times daily.   Yes [provider]  ARIPiprazole (ABILIFY) 5 MG tablet Take 5 mg by mouth at bedtime. 11/10/21  Yes [provider]  desvenlafaxine (PRISTIQ) 100 MG 24 hr tablet TAKE 1 TABLET BY MOUTH ONCE DAILY 05/16/17  Yes Ofilia Neas, PA-C  Desvenlafaxine ER 100 MG TB24 Take 1 tablet by mouth every morning. 11/14/21  Yes [provider]  lisinopril-hydrochlorothiazide (ZESTORETIC) 20-25 MG tablet Take 1 tablet by mouth daily. 04/03/22  Yes Janeece Agee, NP  omeprazole (PRILOSEC) 20 MG capsule Take 40 mg by mouth daily.   Yes [provider]  sulfamethoxazole-trimethoprim (BACTRIM DS) 800-160 MG tablet Take 1 tablet by mouth 2 (two) times daily for 5 days.  04/21/23 04/26/23 Yes Xela Oregel, Noberto Retort, PA-C  tretinoin (RETIN-A) 0.05 % cream Apply 1 application topically at bedtime.    [provider]    Family History Family History  Problem Relation Age of Onset   Rheum arthritis Mother    Depression Mother    Sleep apnea Mother    Arrhythmia Mother    Esophageal cancer Father    Heart attack Sister    Diabetes Sister    Heart Problems Sister    Breast cancer Neg Hx     Social History Social History   Tobacco Use   Smoking status: Every Day    Current packs/day: 0.25    Types: Cigarettes   Smokeless tobacco: Never  Vaping Use   Vaping status: Never Used  Substance Use Topics   Alcohol use: Yes    Alcohol/week: 0.0 standard drinks of alcohol    Comment: on occasion   Drug use: Yes     Types: Marijuana    Comment: 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 Negative for appetite change, fatigue and fever.  Gastrointestinal:  Negative for abdominal pain, diarrhea, nausea and vomiting.  Genitourinary:  Positive for dysuria, frequency and urgency. Negative for hematuria, pelvic pain, vaginal bleeding, vaginal discharge and vaginal pain.  Musculoskeletal:  Positive for back pain. Negative for arthralgias and myalgias.     Physical Exam Triage Vital Signs ED Triage Vitals [04/21/23 1658]  Encounter Vitals Group     BP 132/85     Systolic BP Percentile      Diastolic BP Percentile      Pulse Rate 94     Resp 16     Temp 98.4 F (36.9 C)     Temp Source Oral     SpO2 96 %     Weight      Height      Head Circumference      Peak Flow      Pain Score 7     Pain Loc      Pain Education      Exclude from Growth Chart    No data found.  Updated Vital Signs BP 132/85 (BP Location: Right Arm)   Pulse 94   Temp 98.4 F (36.9 C) (Oral)   Resp 16   LMP 10/07/2017   SpO2 96%   Visual Acuity Right Eye Distance:   Left Eye Distance:   Bilateral Distance:    Right Eye Near:   Left Eye Near:    Bilateral Near:     Physical Exam Vitals reviewed.  Constitutional:      General: She is awake. She is not in acute distress.    Appearance: Normal appearance. She is well-developed. She is not ill-appearing.     Comments: Very pleasant female appears stated age in no acute distress sitting comfortably in exam room  HENT:     Head: Normocephalic and atraumatic.  Cardiovascular:     Rate and Rhythm: Normal rate and regular rhythm.     Heart sounds: Normal heart sounds, S1 normal and S2 normal. No murmur heard. Pulmonary:     Effort: Pulmonary effort is normal.     Breath sounds: Normal breath sounds. No wheezing, rhonchi or rales.      Comments: Clear to auscultation bilaterally Abdominal:     General: Bowel sounds are normal.  Palpations: Abdomen is soft.     Tenderness: There is abdominal tenderness in the suprapubic area. There is no right CVA tenderness, left CVA tenderness, guarding or rebound.     Comments: Benign abdominal exam.  Mild suprapubic tenderness.  No CVA tenderness.  Psychiatric:        Behavior: Behavior is cooperative.      UC Treatments / Results  Labs (all labs ordered are listed, but only abnormal results are displayed) Labs Reviewed  POCT URINALYSIS DIP (MANUAL ENTRY) - Abnormal; Notable for the following components:      Result Value   Color, UA orange (*)    Glucose, UA =100 (*)    Blood, UA trace-intact (*)    Protein Ur, POC trace (*)    Nitrite, UA Positive (*)    Leukocytes, UA Trace (*)    All other components within normal limits  URINE CULTURE    EKG   Radiology No results found.  Procedures Procedures (including critical care time)  Medications Ordered in UC Medications - No data to display  Initial Impression / Assessment and Plan / UC Course  I have reviewed the triage vital signs and the nursing notes.  Pertinent labs & imaging results that were available during my care of the patient were reviewed by me and considered in my medical decision making (see chart for details).     Patient is well-appearing, afebrile, nontoxic, nontachycardic.  UA does show evidence of infection though it is possible this is color interference given patient has been taking Azo.  Will empirically treat given her clinical presentation with Bactrim DS twice daily for 5 days.  We discussed that if she develops any rash or lesions to stop the medication to be seen immediately.  She is to drink plenty of fluid and rest.  She does have some hemoglobin on her dipstick today we discussed that she should follow-up with her primary care in 2 to 4 weeks to have this repeated and ensure that this  resolves after treatment of the infection.  Will send her urine for culture and we will contact her if we need to discontinue or change her antibiotics based on culture results.  Discussed that if she has any worsening or changing symptoms including abdominal pain, pelvic pain, fever, nausea, vomiting she needs to be seen immediately.  Strict return precautions given.  Work excuse note provided.  Final Clinical Impressions(s) / UC Diagnoses   Final diagnoses:  Acute cystitis with hematuria     Discharge Instructions      It appears you for urinary tract infection.  Start Bactrim DS twice daily for 5 days.  If develop any rash or lesions in your mouth stop the medication to be seen immediately.  Continue drinking plenty of fluid.  We will contact you if we need to change or stop your antibiotic based on your culture result.  If you do not hear from [place] please continue the medication until you finish it all.  I do recommend you follow-up with your primary care in 2 to 4 weeks after we have treated the infection to make sure that the blood we saw today resolves.  If you have any worsening symptoms including abdominal pain, pelvic pain, fever, nausea, vomiting you need to be seen immediately.     ED Prescriptions     Medication Sig Dispense Auth. Provider   sulfamethoxazole-trimethoprim (BACTRIM DS) 800-160 MG tablet Take 1 tablet by mouth 2 (two) times daily for 5  days. 10 tablet Kadin Bera, Noberto Retort, PA-C      PDMP not reviewed this encounter.   Jeani Hawking, PA-C 04/21/23 1735

## 2023-04-21 NOTE — Discharge Instructions (Signed)
It appears you for urinary tract infection.  Start Bactrim DS twice daily for 5 days.  If develop any rash or lesions in your mouth stop the medication to be seen immediately.  Continue drinking plenty of fluid.  We will contact you if we need to change or stop your antibiotic based on your culture result.  If you do not hear from [place] please continue the medication until you finish it all.  I do recommend you follow-up with your primary care in 2 to 4 weeks after we have treated the infection to make sure that the blood we saw today resolves.  If you have any worsening symptoms including abdominal pain, pelvic pain, fever, nausea, vomiting you need to be seen immediately.

## 2023-04-21 NOTE — ED Triage Notes (Signed)
Reports new burning with urination, urinary frequency, and back pain x 3 days. Believes it may be a UTI. Has been using AZO and increased water intake with minimal improvement in symptoms.

## 2023-04-22 ENCOUNTER — Telehealth (HOSPITAL_COMMUNITY): Payer: Self-pay

## 2023-04-22 ENCOUNTER — Ambulatory Visit: Payer: Self-pay

## 2023-04-22 MED ORDER — SULFAMETHOXAZOLE-TRIMETHOPRIM 800-160 MG PO TABS: 1.0000 | ORAL_TABLET | Freq: Two times a day (BID) | ORAL | 0 refills | Status: AC

## 2023-04-23 LAB — URINE CULTURE: Culture: >=100000 — AB

## 2023-07-04 ENCOUNTER — Ambulatory Visit (INDEPENDENT_AMBULATORY_CARE_PROVIDER_SITE_OTHER): Payer: 59 | Admitting: Family Medicine

## 2023-07-04 ENCOUNTER — Other Ambulatory Visit (HOSPITAL_BASED_OUTPATIENT_CLINIC_OR_DEPARTMENT_OTHER): Payer: Self-pay

## 2023-07-04 ENCOUNTER — Encounter (HOSPITAL_BASED_OUTPATIENT_CLINIC_OR_DEPARTMENT_OTHER): Payer: Self-pay | Admitting: Family Medicine

## 2023-07-04 ENCOUNTER — Other Ambulatory Visit (HOSPITAL_COMMUNITY)
Admission: RE | Admit: 2023-07-04 | Discharge: 2023-07-04 | Disposition: A | Discharge status: Home / Self Care | Payer: 59 | Source: Ambulatory Visit | Attending: Family Medicine | Admitting: Family Medicine

## 2023-07-04 VITALS — BP 155/84 | HR 78 | Ht 68.75 in | Wt 196.0 lb

## 2023-07-04 DIAGNOSIS — Z0000 Encounter for general adult medical examination without abnormal findings: Secondary | ICD-10-CM

## 2023-07-04 DIAGNOSIS — F17200 Nicotine dependence, unspecified, uncomplicated: Secondary | ICD-10-CM

## 2023-07-04 DIAGNOSIS — F330 Major depressive disorder, recurrent, mild: Secondary | ICD-10-CM

## 2023-07-04 DIAGNOSIS — Z1231 Encounter for screening mammogram for malignant neoplasm of breast: Secondary | ICD-10-CM | POA: Diagnosis not present

## 2023-07-04 DIAGNOSIS — Z1212 Encounter for screening for malignant neoplasm of rectum: Secondary | ICD-10-CM

## 2023-07-04 DIAGNOSIS — F900 Attention-deficit hyperactivity disorder, predominantly inattentive type: Secondary | ICD-10-CM

## 2023-07-04 DIAGNOSIS — Z1211 Encounter for screening for malignant neoplasm of colon: Secondary | ICD-10-CM

## 2023-07-04 DIAGNOSIS — F419 Anxiety disorder, unspecified: Secondary | ICD-10-CM | POA: Diagnosis not present

## 2023-07-04 DIAGNOSIS — Z124 Encounter for screening for malignant neoplasm of cervix: Secondary | ICD-10-CM

## 2023-07-04 DIAGNOSIS — I10 Essential (primary) hypertension: Secondary | ICD-10-CM

## 2023-07-04 DIAGNOSIS — Z7689 Persons encountering health services in other specified circumstances: Secondary | ICD-10-CM

## 2023-07-04 MED ORDER — LISINOPRIL-HYDROCHLOROTHIAZIDE 20-25 MG PO TABS: 1.0000 | ORAL_TABLET | Freq: Every day | ORAL | 0 refills | Status: DC

## 2023-07-04 MED ORDER — ZOSTER VAC RECOMB ADJUVANTED 50 MCG/0.5ML IM SUSR
0.5000 mL | Freq: Once | INTRAMUSCULAR | 0 refills | Status: AC
  Filled 2023-07-04 – 2023-07-18 (×2): qty 0.5, 1d supply, fill #0

## 2023-07-04 MED ORDER — TRETINOIN 0.05 % EX CREA: 1.0000 "application " | TOPICAL_CREAM | Freq: Every day | CUTANEOUS | 1 refills | Status: AC

## 2023-07-04 NOTE — Patient Instructions (Signed)

## 2023-07-04 NOTE — Progress Notes (Signed)
New Patient Office Visit & Complete Physical Exam   Subjective    Patient ID: Kristy Rivas, female    DOB: [birth year]  Age: 62 y.o. MRN: [id number]  CC:  Chief Complaint  Patient presents with   Hypertension   HPI Kristy Rivas is a 62 year old female who presents to establish with Primary Care & Sports Medicine at [facility]. She would like to have her physical exam done today also.  Does not participate in daily exercise   Former PCP: [facility]  Psychiatry: Dr. Evelene Croon  Mood- currently taking  ADHD- Adderall 20mg  BID, Xanax 0.5mg  at bedtime PRN, Pristiq 100mg  daily, Abilify 5mg  at bedtime  HTN: lisinopril-HCTZ 20-25mg   Headaches first few days off of BP meds   Tobacco use: not interested in quitting   Outpatient Encounter Medications as of 07/04/2023  Medication Sig   acetaminophen (TYLENOL) 500 MG tablet Take 500 mg by mouth every 6 (six) hours as needed. For pain   ALPRAZolam (XANAX) 0.5 MG tablet Take 1 tablet (0.5 mg total) by mouth at bedtime as needed (For panic symptoms only.).   amphetamine-dextroamphetamine (ADDERALL) 20 MG tablet Take 20 mg by mouth 2 (two) times daily.   ARIPiprazole (ABILIFY) 5 MG tablet Take 5 mg by mouth at bedtime.   desvenlafaxine (PRISTIQ) 100 MG 24 hr tablet TAKE 1 TABLET BY MOUTH ONCE DAILY   omeprazole (PRILOSEC) 20 MG capsule Take 40 mg by mouth daily.   [DISCONTINUED] lisinopril-hydrochlorothiazide (ZESTORETIC) 20-25 MG tablet Take 1 tablet by mouth daily.   [DISCONTINUED] tretinoin (RETIN-A) 0.05 % cream Apply 1 application topically at bedtime.   lisinopril-hydrochlorothiazide (ZESTORETIC) 20-25 MG tablet Take 1 tablet by mouth daily.   tretinoin (RETIN-A) 0.05 % cream Apply 1 application  topically at bedtime.   [DISCONTINUED] Desvenlafaxine ER 100 MG TB24 Take 1 tablet by mouth every morning. (Patient not taking: Reported on 07/04/2023)   No facility-administered encounter medications on file as  of 07/04/2023.    Past Medical History:  Diagnosis Date   ADD (attention deficit disorder) without hyperactivity    Anxiety    Anxiety and depression    Depression    GERD (gastroesophageal reflux disease)    Hypertension     Past Surgical History:  Procedure Laterality Date   FINGER SURGERY      Family History  Problem Relation Age of Onset   Rheum arthritis Mother    Depression Mother    Sleep apnea Mother    Arrhythmia Mother    Esophageal cancer Father    Heart attack Sister    Diabetes Sister    Heart Problems Sister    Breast cancer Neg Hx    Review of Systems  Constitutional:  Negative for malaise/fatigue.  HENT:  Negative for ear pain and tinnitus.   Eyes:  Negative for blurred vision and double vision.  Respiratory:  Negative for cough and shortness of breath.   Cardiovascular:  Positive for leg swelling. Negative for chest pain and palpitations.  Gastrointestinal:  Negative for abdominal pain, nausea and vomiting.  Genitourinary:  Negative for frequency and urgency.  Musculoskeletal:  Negative for myalgias.  Neurological:  Negative for dizziness, weakness and headaches.  Psychiatric/Behavioral:  Negative for depression and suicidal ideas. The patient is not nervous/anxious and does not have insomnia.       Objective    BP (!) 155/84 Comment: Repeat BP no meds  Pulse 78   Ht 5' 8.75" (1.746 m)   Wt 196 lb (  88.9 kg)   LMP 10/07/2017   SpO2 100%   BMI 29.16 kg/m   Physical Exam Vitals reviewed. Exam conducted with a chaperone present.  Constitutional:      Appearance: Normal appearance.  HENT:     Head: Normocephalic.     Right Ear: Tympanic membrane, ear canal and external ear normal.     Left Ear: Tympanic membrane, ear canal and external ear normal.     Nose: Nose normal.     Mouth/Throat:     Mouth: Mucous membranes are moist.     Pharynx: Oropharynx is clear.  Eyes:     Extraocular Movements: Extraocular movements intact.      Conjunctiva/sclera: Conjunctivae normal.     Pupils: Pupils are equal, round, and reactive to light.  Cardiovascular:     Rate and Rhythm: Normal rate and regular rhythm.     Pulses: Normal pulses.     Heart sounds: Normal heart sounds.  Pulmonary:     Effort: Pulmonary effort is normal.     Breath sounds: Normal breath sounds.  Abdominal:     General: Bowel sounds are normal.     Palpations: Abdomen is soft.     Hernia: There is no hernia in the left inguinal area or right inguinal area.  Genitourinary:    General: Normal vulva.     Exam position: Lithotomy position.     Pubic Area: No rash or pubic lice.      Tanner stage (genital): 5.     Labia:        Right: No rash, tenderness, lesion or injury.        Left: No rash, tenderness, lesion or injury.      Urethra: No prolapse, urethral pain, urethral swelling or urethral lesion.     Vagina: Normal.     Cervix: Normal.     Uterus: Normal.   Musculoskeletal:        General: Normal range of motion.     Cervical back: Normal range of motion.  Lymphadenopathy:     Lower Body: No right inguinal adenopathy. No left inguinal adenopathy.  Skin:    General: Skin is warm and dry.  Neurological:     Mental Status: She is alert.  Psychiatric:        Mood and Affect: Mood normal.        Behavior: Behavior normal.      Assessment & Plan:   1. Wellness examination Routine HCM labs ordered. Will obtain non-fasting labs today and update patient with results.  Review of PMH, FH, SH, medications and HM performed. Preventative care hand-out provided.  Recommend healthy diet.  Recommend approximately 150 minutes/week of moderate intensity exercise. Recommend regular dental and vision exams. Always use seatbelt/lap and shoulder restraints. Recommend using smoke alarms and checking batteries at least twice a year. Recommend using sunscreen when outside. Discussed immunization recommendations for shingles and tetanus vaccine. Patient agreed  to proceed with this today. Will receive Shingrix in [facility] pharmacy. Vaccines are UTD.  - CBC with Differential/Platelet - Comprehensive metabolic panel - Hemoglobin A1c - Lipid panel - TSH Rfx on Abnormal to Free T4  2. Breast cancer screening by mammogram Order placed for mammogram.  - MM 3D SCREENING MAMMOGRAM BILATERAL BREAST; Future  3. Screening for colorectal cancer Discussed colon cancer screening recommendations and options.  Patient agreeable to Cologuard- order placed today.  - Cologuard  4. Screening for cervical cancer Patient presents today for cervical cancer screening. Patient agreeable  to procedure today. Patient has no concerns today and no symptoms. Chaperone Rolland Bimler [place], [place]) present. No inguinal lymphadenopathy present. Vulva with appropriate hair distribution, no lesions present. Vagina with no atrophy noted, no lesions, cystocele, or rectocele present. Urethra with no masses, tenderness or scarring present. Cervical os visualized. Cervix non-tender. Scant amount of white discharge and blood present at os opening. Pap completed. Will update patient with results.    - Cytology - PAP  5. Encounter to establish care Patient is a 68- year-old female who presents today to establish care with primary care at [facility]. Reviewed the past medical history, family history, social history, surgical history, medications and allergies today- updates made as indicated.   6. Tobacco use disorder Discussed importance of smoking cessation on health and improvement with blood pressure. She is not interested in quitting at this time. Advised patient to reach out if she becomes interested in smoking cessation pharmacotherapy.   7. Essential hypertension, benign Patient presents today with slightly elevated blood pressure, recheck with no improvement. Patient in no acute distress and is well-appearing. Denies chest pain, shortness of breath, lower extremity edema, vision  changes, headaches. Cardiovascular exam with heart regular rate and rhythm. Normal heart sounds, no murmurs present. No lower extremity edema present. Lungs clear to auscultation bilaterally. Patient previously prescribed lisinopril-HCTZ 20-25mg  daily. Prescription sent to pharmacy. Advised patient to monitor blood pressure at home. Return to office sooner if blood pressure begins to increase greater than 130/80.  - lisinopril-hydrochlorothiazide (ZESTORETIC) 20-25 MG tablet; Take 1 tablet by mouth daily.  Dispense: 90 tablet; Refill: 0  8. MDD (major depressive disorder), recurrent episode, mild (HCC) Followed by psychiatry. Currently taking Pristiq 100mg  daily & Abilify 5mg  at bedtime.   9. Anxiety Followed by psychiatry. Currently taking Xanax 0.5mg  at bedtime PRN.   10. Attention deficit hyperactivity disorder (ADHD), predominantly inattentive type Followed by psychiatry. Currently taking Adderall 20mg  BID for concentration.    Return in about 6 months (around 01/02/2024) for HTN follow-up.   Alyson Reedy, FNP

## 2023-07-05 LAB — CBC WITH DIFFERENTIAL/PLATELET
Basophils Absolute: 0.1 10*3/uL (ref 0.0–0.2)
Basos: 1 %
EOS (ABSOLUTE): 0.1 10*3/uL (ref 0.0–0.4)
Eos: 1 %
Hematocrit: 42.8 % (ref 34.0–46.6)
Hemoglobin: 13.7 g/dL (ref 11.1–15.9)
Immature Grans (Abs): 0 10*3/uL (ref 0.0–0.1)
Immature Granulocytes: 0 %
Lymphocytes Absolute: 2.3 10*3/uL (ref 0.7–3.1)
Lymphs: 38 %
MCH: 31.1 pg (ref 26.6–33.0)
MCHC: 32 g/dL (ref 31.5–35.7)
MCV: 97 fL (ref 79–97)
Monocytes Absolute: 0.4 10*3/uL (ref 0.1–0.9)
Monocytes: 6 %
Neutrophils Absolute: 3.2 10*3/uL (ref 1.4–7.0)
Neutrophils: 54 %
Platelets: 254 10*3/uL (ref 150–450)
RBC: 4.41 x10E6/uL (ref 3.77–5.28)
RDW: 11.7 % (ref 11.7–15.4)
WBC: 6 10*3/uL (ref 3.4–10.8)

## 2023-07-05 LAB — COMPREHENSIVE METABOLIC PANEL
ALT: 36 [IU]/L — ABNORMAL HIGH (ref 0–32)
AST: 22 [IU]/L (ref 0–40)
Albumin: 4.5 g/dL (ref 3.9–4.9)
Alkaline Phosphatase: 127 [IU]/L — ABNORMAL HIGH (ref 44–121)
BUN/Creatinine Ratio: 18 (ref 12–28)
BUN: 12 mg/dL (ref 8–27)
Bilirubin Total: 0.5 mg/dL (ref 0.0–1.2)
CO2: 23 mmol/L (ref 20–29)
Calcium: 9.5 mg/dL (ref 8.7–10.3)
Chloride: 100 mmol/L (ref 96–106)
Creatinine, Ser: 0.67 mg/dL (ref 0.57–1.00)
Globulin, Total: 2.2 g/dL (ref 1.5–4.5)
Glucose: 94 mg/dL (ref 70–99)
Potassium: 3.7 mmol/L (ref 3.5–5.2)
Sodium: 139 mmol/L (ref 134–144)
Total Protein: 6.7 g/dL (ref 6.0–8.5)
eGFR: 99 mL/min/{1.73_m2} (ref >59)

## 2023-07-05 LAB — LIPID PANEL
Chol/HDL Ratio: 4.4 ratio (ref 0.0–4.4)
Cholesterol, Total: 199 mg/dL (ref 100–199)
HDL: 45 mg/dL (ref >39)
LDL Chol Calc (NIH): 114 mg/dL — ABNORMAL HIGH (ref 0–99)
Triglycerides: 229 mg/dL — ABNORMAL HIGH (ref 0–149)
VLDL Cholesterol Cal: 40 mg/dL (ref 5–40)

## 2023-07-05 LAB — TSH RFX ON ABNORMAL TO FREE T4: TSH: 1.31 u[IU]/mL (ref 0.450–4.500)

## 2023-07-05 LAB — HEMOGLOBIN A1C
Est. average glucose Bld gHb Est-mCnc: 114 mg/dL
Hgb A1c MFr Bld: 5.6 % (ref 4.8–5.6)

## 2023-07-06 LAB — CYTOLOGY - PAP
Adequacy: ABSENT
Comment: NEGATIVE
Diagnosis: UNDETERMINED — AB
High risk HPV: POSITIVE — AB

## 2023-07-07 ENCOUNTER — Other Ambulatory Visit (HOSPITAL_BASED_OUTPATIENT_CLINIC_OR_DEPARTMENT_OTHER): Payer: Self-pay | Admitting: Family Medicine

## 2023-07-07 DIAGNOSIS — R87610 Atypical squamous cells of undetermined significance on cytologic smear of cervix (ASC-US): Secondary | ICD-10-CM

## 2023-07-12 DIAGNOSIS — Z1212 Encounter for screening for malignant neoplasm of rectum: Secondary | ICD-10-CM | POA: Diagnosis not present

## 2023-07-12 DIAGNOSIS — Z1211 Encounter for screening for malignant neoplasm of colon: Secondary | ICD-10-CM | POA: Diagnosis not present

## 2023-07-13 ENCOUNTER — Other Ambulatory Visit (HOSPITAL_BASED_OUTPATIENT_CLINIC_OR_DEPARTMENT_OTHER): Payer: Self-pay | Admitting: Family Medicine

## 2023-07-17 DIAGNOSIS — F3342 Major depressive disorder, recurrent, in full remission: Secondary | ICD-10-CM | POA: Diagnosis not present

## 2023-07-17 DIAGNOSIS — F411 Generalized anxiety disorder: Secondary | ICD-10-CM | POA: Diagnosis not present

## 2023-07-17 DIAGNOSIS — F900 Attention-deficit hyperactivity disorder, predominantly inattentive type: Secondary | ICD-10-CM | POA: Diagnosis not present

## 2023-07-18 ENCOUNTER — Other Ambulatory Visit (HOSPITAL_BASED_OUTPATIENT_CLINIC_OR_DEPARTMENT_OTHER): Payer: Self-pay

## 2023-07-18 ENCOUNTER — Encounter (HOSPITAL_BASED_OUTPATIENT_CLINIC_OR_DEPARTMENT_OTHER): Payer: Self-pay | Admitting: Certified Nurse Midwife

## 2023-07-18 ENCOUNTER — Ambulatory Visit (HOSPITAL_BASED_OUTPATIENT_CLINIC_OR_DEPARTMENT_OTHER): Payer: 59 | Admitting: Certified Nurse Midwife

## 2023-07-18 VITALS — BP 124/70 | HR 84 | Ht 68.75 in | Wt 194.0 lb

## 2023-07-18 DIAGNOSIS — R87610 Atypical squamous cells of undetermined significance on cytologic smear of cervix (ASC-US): Secondary | ICD-10-CM | POA: Diagnosis not present

## 2023-07-18 DIAGNOSIS — R87810 Cervical high risk human papillomavirus (HPV) DNA test positive: Secondary | ICD-10-CM

## 2023-07-18 NOTE — Progress Notes (Signed)
  GYNECOLOGY  VISIT  CC:   abnormal pap   HPI: 62 y.o. G3P2012 Married White or Caucasian female here for abnormal pap smear on 07/04/23.   Pap Smear 08/13/2018 LGSIL with HPV "Not Detected".    Visit dated 09/30/2018 indicates pt presented for Colposcopy. Biopsies obtained at 6 and 12 consistent with LGSIL. ECC negative.  Patient states LEEP was recommended but she lost insurance for several years. She recently returned for follow-up pap smear on 07/04/23. 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 For pain     ALPRAZolam (XANAX) 0.5 MG tablet Take 1 tablet (0.5 mg total) by mouth at bedtime as needed (For panic symptoms only.). 7 tablet 0   amphetamine-dextroamphetamine (ADDERALL) 20 MG tablet Take 20 mg by mouth 2 (two) times daily.     ARIPiprazole (ABILIFY) 5 MG tablet Take 5 mg by mouth at bedtime.     desvenlafaxine (PRISTIQ) 100 MG 24 hr tablet TAKE 1 TABLET BY MOUTH ONCE DAILY 30 tablet 2   lisinopril-hydrochlorothiazide (ZESTORETIC) 20-25 MG tablet Take 1 tablet by mouth daily. 90 tablet 0   omeprazole (PRILOSEC) 20 MG capsule Take 40 mg by mouth daily.     tretinoin (RETIN-A) 0.05 % cream Apply 1 application  topically at bedtime. 45 g 1   Zoster Vaccine Adjuvanted [facility]) injection Inject 0.5 mLs into the muscle once for 1 dose. 0.5 mL 0   No current facility-administered medications on file prior to visit.    ALLERGIES: Atorvastatin and Latex  PHYSICAL  EXAMINATION:    BP 124/70 (BP Location: Right Arm, Patient Position: Sitting, Cuff Size: Normal)   Pulse 84   Ht 5' 8.75" (1.746 m)   Wt 194 lb (88 kg)   LMP 10/07/2017   BMI 28.86 kg/m     General appearance: alert, cooperative and appears stated age  Assessment/Plan: 1. ASCUS with positive high risk HPV cervical - Discussed recommendation for Colposcopy with ECC and possible cervical biopsies. Discussed that tobacco use may predispose to HPV and may cause the body to take longer to resolve the virus. She will RTO for Colposcopy.  Letta Kocher

## 2023-07-19 LAB — COLOGUARD: COLOGUARD: NEGATIVE

## 2023-07-24 ENCOUNTER — Telehealth (HOSPITAL_BASED_OUTPATIENT_CLINIC_OR_DEPARTMENT_OTHER): Payer: Self-pay | Admitting: Certified Nurse Midwife

## 2023-07-24 ENCOUNTER — Encounter (HOSPITAL_BASED_OUTPATIENT_CLINIC_OR_DEPARTMENT_OTHER): Payer: 59 | Admitting: Certified Nurse Midwife

## 2023-07-24 NOTE — Progress Notes (Unsigned)
No Show for Colposcopy appointment 07/24/23 2:06pm.  Lupita Leash K Darshawn Boateng

## 2023-07-24 NOTE — Telephone Encounter (Signed)
Called patient and left a message to call the office back to re reschedule her missed appointment today .

## 2023-07-25 ENCOUNTER — Ambulatory Visit (INDEPENDENT_AMBULATORY_CARE_PROVIDER_SITE_OTHER): Payer: 59 | Admitting: Certified Nurse Midwife

## 2023-07-25 ENCOUNTER — Other Ambulatory Visit (HOSPITAL_COMMUNITY)
Admission: RE | Admit: 2023-07-25 | Discharge: 2023-07-25 | Disposition: A | Discharge status: Home / Self Care | Payer: 59 | Source: Ambulatory Visit | Attending: Certified Nurse Midwife | Admitting: Certified Nurse Midwife

## 2023-07-25 ENCOUNTER — Encounter (HOSPITAL_BASED_OUTPATIENT_CLINIC_OR_DEPARTMENT_OTHER): Payer: Self-pay | Admitting: Certified Nurse Midwife

## 2023-07-25 VITALS — BP 151/82 | HR 88 | Ht 68.75 in | Wt 195.4 lb

## 2023-07-25 DIAGNOSIS — R87810 Cervical high risk human papillomavirus (HPV) DNA test positive: Secondary | ICD-10-CM | POA: Insufficient documentation

## 2023-07-25 DIAGNOSIS — R87610 Atypical squamous cells of undetermined significance on cytologic smear of cervix (ASC-US): Secondary | ICD-10-CM

## 2023-07-25 NOTE — Addendum Note (Signed)
Addended byMerrilee Jansky on: 07/25/2023 03:41 PM   Modules accepted: Orders

## 2023-07-25 NOTE — Progress Notes (Signed)
62 y.o. [id number] Married White or Caucasian female here for Colposcopy.   Pap Smear 08/13/2018 LGSIL with HPV "Not Detected".     Visit dated 09/30/2018 indicates pt presented for Colposcopy. Biopsies obtained at 6 and 12 consistent with LGSIL. ECC negative.   Patient states LEEP was recommended in 2020 but she lost insurance for several years. She recently returned for follow-up pap smear on 07/04/23. 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    Patient ID: Kristy Rivas, female   DOB: May 10, 1961, 62 y.o.   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    Past Surgical History:  Procedure Laterality Date   FINGER SURGERY      Family History  Problem Relation Age of Onset   Rheum arthritis Mother    Depression Mother    Sleep apnea Mother    Arrhythmia Mother    Esophageal cancer Father    Heart attack Sister    Diabetes Sister    Heart Problems Sister    Breast cancer Neg Hx     Social History Social History   Tobacco Use   Smoking status: Every Day    Current packs/day: 0.25    Types: Cigarettes   Smokeless tobacco: Never  Vaping Use   Vaping status: Never Used  Substance Use Topics   Alcohol use: Yes    Alcohol/week: 0.0 standard drinks of alcohol    Comment: on occasion   Drug use: Yes    Types: Marijuana    Comment: 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 For pain     ALPRAZolam (XANAX) 0.5 MG tablet Take 1 tablet (0.5 mg total) by mouth at bedtime as needed (For panic symptoms only.). 7 tablet 0   amphetamine-dextroamphetamine (ADDERALL) 20 MG tablet Take 20 mg by mouth 2 (two) times daily.     ARIPiprazole (ABILIFY) 5 MG tablet Take 5 mg by mouth at bedtime.     desvenlafaxine (PRISTIQ) 100 MG 24 hr tablet TAKE 1 TABLET BY MOUTH ONCE DAILY 30 tablet 2   lisinopril-hydrochlorothiazide (ZESTORETIC) 20-25 MG tablet Take 1 tablet by mouth daily. 90 tablet 0   omeprazole (PRILOSEC) 20 MG capsule Take 40 mg by mouth daily.     tretinoin (RETIN-A) 0.05 % cream Apply 1 application  topically at bedtime. 45 g 1   No current facility-administered medications for this visit.    Review of Systems Review of Systems  Blood pressure (!) 151/82, pulse 88, height 5' 8.75" (1.746 m), weight 195 lb 6.4 oz (88.6 kg), last menstrual period 10/07/2017.   Data Reviewed Prior pap smears/records  Assessment    Procedure Details  The risks and benefits of the procedure and Written informed  consent obtained.  Speculum placed in vagina and excellent visualization of cervix achieved, cervix swabbed x 3 with acetic acid solution.  Specimens: ECC  Complications: none.     Plan    Specimen  labelled and sent to Pathology. Follow-up with patient via MyChart once ECC results final Plan repeat pap smear with repeat HPV CoTesting in 1 year-pt verbalizes understanding.  [name] 07/25/2023, 3:13 PM

## 2023-07-26 ENCOUNTER — Encounter (HOSPITAL_BASED_OUTPATIENT_CLINIC_OR_DEPARTMENT_OTHER): Payer: Self-pay | Admitting: Family Medicine

## 2023-07-27 LAB — SURGICAL PATHOLOGY

## 2023-09-29 ENCOUNTER — Other Ambulatory Visit (HOSPITAL_BASED_OUTPATIENT_CLINIC_OR_DEPARTMENT_OTHER): Payer: Self-pay | Admitting: Family Medicine

## 2023-09-29 DIAGNOSIS — I10 Essential (primary) hypertension: Secondary | ICD-10-CM

## 2023-12-26 ENCOUNTER — Other Ambulatory Visit (HOSPITAL_BASED_OUTPATIENT_CLINIC_OR_DEPARTMENT_OTHER): Payer: Self-pay | Admitting: Family Medicine

## 2023-12-26 DIAGNOSIS — I10 Essential (primary) hypertension: Secondary | ICD-10-CM

## 2024-01-02 ENCOUNTER — Ambulatory Visit (HOSPITAL_BASED_OUTPATIENT_CLINIC_OR_DEPARTMENT_OTHER): Payer: 59 | Admitting: Family Medicine
